# Patient Record
Sex: Female | Born: 1955 | Race: White | Hispanic: No | Marital: Single | Smoking: Current every day smoker
Health system: Southern US, Community
[De-identification: ages and names within clinical notes are randomized; demographics above are authoritative.]

## PROBLEM LIST (undated history)

## (undated) DIAGNOSIS — M1990 Unspecified osteoarthritis, unspecified site: Secondary | ICD-10-CM

## (undated) DIAGNOSIS — F32A Depression, unspecified: Secondary | ICD-10-CM

## (undated) DIAGNOSIS — F419 Anxiety disorder, unspecified: Secondary | ICD-10-CM

## (undated) DIAGNOSIS — E785 Hyperlipidemia, unspecified: Secondary | ICD-10-CM

## (undated) DIAGNOSIS — F329 Major depressive disorder, single episode, unspecified: Secondary | ICD-10-CM

## (undated) HISTORY — DX: Hyperlipidemia, unspecified: E78.5

## (undated) HISTORY — PX: TONSILLECTOMY: SUR1361

## (undated) HISTORY — DX: Depression, unspecified: F32.A

## (undated) HISTORY — DX: Anxiety disorder, unspecified: F41.9

## (undated) HISTORY — DX: Unspecified osteoarthritis, unspecified site: M19.90

## (undated) HISTORY — DX: Major depressive disorder, single episode, unspecified: F32.9

## (undated) HISTORY — PX: COLONOSCOPY: SHX174

---

## 2001-01-18 ENCOUNTER — Emergency Department (HOSPITAL_COMMUNITY): Admission: EM | Admit: 2001-01-18 | Discharge: 2001-01-18 | Payer: Self-pay | Admitting: *Deleted

## 2001-02-07 ENCOUNTER — Emergency Department (HOSPITAL_COMMUNITY): Admission: EM | Admit: 2001-02-07 | Discharge: 2001-02-07 | Payer: Self-pay | Admitting: Emergency Medicine

## 2011-08-03 ENCOUNTER — Institutional Professional Consult (permissible substitution): Payer: Self-pay | Admitting: Medical

## 2011-08-04 ENCOUNTER — Encounter: Payer: Self-pay | Admitting: Medical

## 2011-08-04 ENCOUNTER — Ambulatory Visit (INDEPENDENT_AMBULATORY_CARE_PROVIDER_SITE_OTHER): Payer: BC Managed Care – PPO | Admitting: Medical

## 2011-08-04 VITALS — BP 138/80 | HR 76 | Temp 98.3°F | Resp 18 | Ht 64.0 in | Wt 147.0 lb

## 2011-08-04 DIAGNOSIS — F4321 Adjustment disorder with depressed mood: Secondary | ICD-10-CM

## 2011-08-04 DIAGNOSIS — F32A Depression, unspecified: Secondary | ICD-10-CM

## 2011-08-04 DIAGNOSIS — F329 Major depressive disorder, single episode, unspecified: Secondary | ICD-10-CM

## 2011-08-04 MED ORDER — FLUOXETINE HCL 20 MG PO TABS: ORAL_TABLET | ORAL | Status: DC

## 2011-08-04 NOTE — Patient Instructions (Signed)
Begin Fluoxetine 1/2 tablet daily for the first week, then 1 tablet daily after that.  I want to see you back in 2 weeks to make sure the medication is helping.  Please don't hesitate to call for any reason.  I wrote a note today to get you back into work.

## 2011-08-05 ENCOUNTER — Encounter: Payer: Self-pay | Admitting: Medical

## 2011-08-05 DIAGNOSIS — F418 Other specified anxiety disorders: Secondary | ICD-10-CM | POA: Insufficient documentation

## 2011-08-05 DIAGNOSIS — F329 Major depressive disorder, single episode, unspecified: Secondary | ICD-10-CM | POA: Insufficient documentation

## 2011-08-05 NOTE — Progress Notes (Signed)
Subjective:   HPI  Hayley Dixon is a 56 y.o. female who presents as a new patient today.  She has not had routine medical care in several years.  She is past due on preventative care including mammogram.  She is here today because she needs a release back to work.  She took leave from work in Jun 24, 2023 after her mother passed away of lung and bone cancer.  Unfortunately in 2023/06/24, she has had 2 other deaths to grieve over including an aunt and her mother's best friend.  She has had a hard time dealing with these deaths.  She had never been on medication or had counseling prior for depression although she notes hx/o depression.  She has been out of work 8 weeks, and feels like she needs to get back to work to help move on. No other c/o.  The following portions of the patient's history were reviewed and updated as appropriate: allergies, current medications, past family history, past medical history, past social history, past surgical history and problem list.  Past Medical History  Diagnosis Date  . Depression     No Known Allergies   Review of Systems ROS reviewed and was negative other than noted in HPI or above.    Objective:   Physical Exam  General appearance: alert, no distress, WD/WN Psych: frequent crying, depressed affect, but pleasant  Assessment and Plan :     Encounter Diagnoses  Name Primary?  . Depression Yes  . Grieving    Discussed her concerns, recent deaths and grief. I recommended she begin medication to help and seek counseling.  I also recommended she cut back on alcohol.  She agrees to trial of Fluoxetine.  I think getting back to work will help get her in a routine again.   Letter give for return to work.  Gave list of psychiatric resources.  Recheck 2 wk. Call/return for any reason sooner prn.

## 2011-08-17 ENCOUNTER — Ambulatory Visit: Payer: BC Managed Care – PPO | Admitting: Medical

## 2017-04-04 DIAGNOSIS — E559 Vitamin D deficiency, unspecified: Secondary | ICD-10-CM | POA: Insufficient documentation

## 2018-12-11 DIAGNOSIS — E785 Hyperlipidemia, unspecified: Secondary | ICD-10-CM | POA: Insufficient documentation

## 2021-10-12 ENCOUNTER — Other Ambulatory Visit: Payer: Self-pay | Admitting: Rehabilitation

## 2021-10-12 DIAGNOSIS — M47817 Spondylosis without myelopathy or radiculopathy, lumbosacral region: Secondary | ICD-10-CM

## 2021-10-18 ENCOUNTER — Ambulatory Visit
Admission: RE | Admit: 2021-10-18 | Discharge: 2021-10-18 | Disposition: A | Discharge status: Home / Self Care | Payer: BC Managed Care – PPO | Source: Ambulatory Visit | Attending: Rehabilitation | Admitting: Rehabilitation

## 2021-10-18 DIAGNOSIS — M47817 Spondylosis without myelopathy or radiculopathy, lumbosacral region: Secondary | ICD-10-CM

## 2021-11-18 ENCOUNTER — Other Ambulatory Visit: Payer: Self-pay | Admitting: *Deleted

## 2021-11-18 DIAGNOSIS — R160 Hepatomegaly, not elsewhere classified: Secondary | ICD-10-CM

## 2022-02-24 ENCOUNTER — Ambulatory Visit: Payer: BLUE CROSS/BLUE SHIELD | Admitting: Family Medicine

## 2022-02-24 ENCOUNTER — Encounter: Payer: Self-pay | Admitting: Family Medicine

## 2022-02-24 VITALS — BP 97/72 | HR 101 | Temp 97.3°F | Ht 63.0 in | Wt 143.0 lb

## 2022-02-24 DIAGNOSIS — R197 Diarrhea, unspecified: Secondary | ICD-10-CM

## 2022-02-24 NOTE — Progress Notes (Unsigned)
New Patient Office Visit  Subjective    Patient ID: Hayley Dixon, female    DOB: [birth year]  Age: 66 y.o. MRN: [id number]  CC:  Chief Complaint  Patient presents with   Diarrhea    New pt. C/o diarrhea, gas and abdominal pain X6 weeks    HPI Hayley Dixon presents to establish care ***  Outpatient Encounter Medications as of 02/24/2022  Medication Sig   atorvastatin (LIPITOR) 10 MG tablet Take 1 tablet(s) every day by oral route for 90 days.   buPROPion (WELLBUTRIN XL) 300 MG 24 hr tablet Take 1 tablet every day by oral route for 30 days.   busPIRone (BUSPAR) 10 MG tablet Take 10 mg by mouth 2 (two) times daily.   diazepam (VALIUM) 5 MG tablet Take 5 mg by mouth daily as needed.   pregabalin (LYRICA) 25 MG capsule Take 1 capsule 4 times a day by oral route as directed.   [DISCONTINUED] FLUoxetine (PROZAC) 20 MG tablet 1/2 tablet po daily x 1 week, then 1 tablet po daily.   [DISCONTINUED] PARoxetine (PAXIL) 40 MG tablet Take 1 tablet(s) every day by oral route for 90 days.   No facility-administered encounter medications on file as of 02/24/2022.    Past Medical History:  Diagnosis Date   Depression     No past surgical history on file.  Family History  Problem Relation Age of Onset   Cancer Mother        died of lung and bone cancer    Social History   Socioeconomic History   Marital status: Single    Spouse name: Not on file   Number of children: Not on file   Years of education: Not on file   Highest education level: Not on file  Occupational History   Not on file  Tobacco Use   Smoking status: Every Day    Packs/day: 0.50    Types: Cigarettes    Start date: 70   Smokeless tobacco: Never  Substance and Sexual Activity   Alcohol use: Never    Alcohol/week: 28.0 standard drinks of alcohol    Types: 28 Cans of beer per week   Drug use: Never   Sexual activity: Yes    Partners: Male  Other Topics Concern   Not on file  Social History Narrative    Not on file   Social Determinants of Health   Financial Resource Strain: Not on file  Food Insecurity: Not on file  Transportation Needs: Not on file  Physical Activity: Not on file  Stress: Not on file  Social Connections: Not on file  Intimate Partner Violence: Not on file    ROS      Objective    BP 97/72   Pulse (!) 101   Temp (!) 97.3 F (36.3 C)   Ht _0  (1.6 m)   Wt 143 lb (64.9 kg)   SpO2 96%   BMI 25.33 kg/m   Physical Exam  {Labs (Optional):23779}    Assessment & Plan:   Problem List Items Addressed This Visit   None Visit Diagnoses     Diarrhea, unspecified type    -  Primary   Relevant Orders   CBC with Differential   Comprehensive Metabolic Panel (CMET)   Procalcitonin   Sed Rate (ESR)   DG Abd 1 View      Differential diagnosis: Colitis, ulcerative colitis, Crohn's, malabsorption, overflow diarrhea, viral infection, diverticulitis, medication side effect  - Patient not acutely  ill and exam relatively benign we will do work-up due to prolonged symptoms of diarrhea for 6 weeks - We discussed option of using Lomotil and we both agreed to hold off for now - Patient is well-hydrated hydrating well - We will follow-up in a few weeks and also have patient get scheduled for visit to establish care at this practice  Return in about 2 weeks (around 03/10/2022) for diarrhea follow-up.   Elwin Mocha, MD

## 2022-02-28 LAB — CBC WITH DIFFERENTIAL/PLATELET

## 2022-02-28 LAB — COMPREHENSIVE METABOLIC PANEL
ALT: 19 IU/L (ref 0–32)
AST: 18 IU/L (ref 0–40)
Albumin/Globulin Ratio: 1.7 (ref 1.2–2.2)
Albumin: 4.5 g/dL (ref 3.9–4.9)
Alkaline Phosphatase: 100 IU/L (ref 44–121)
BUN/Creatinine Ratio: 15 (ref 12–28)
BUN: 15 mg/dL (ref 8–27)
Bilirubin Total: <0.2 mg/dL (ref 0.0–1.2)
CO2: 19 mmol/L — ABNORMAL LOW (ref 20–29)
Calcium: 10.1 mg/dL (ref 8.7–10.3)
Chloride: 93 mmol/L — ABNORMAL LOW (ref 96–106)
Creatinine, Ser: 0.97 mg/dL (ref 0.57–1.00)
Globulin, Total: 2.7 g/dL (ref 1.5–4.5)
Glucose: 100 mg/dL — ABNORMAL HIGH (ref 70–99)
Potassium: 4.4 mmol/L (ref 3.5–5.2)
Sodium: 133 mmol/L — ABNORMAL LOW (ref 134–144)
Total Protein: 7.2 g/dL (ref 6.0–8.5)
eGFR: 66 mL/min/{1.73_m2} (ref >59)

## 2022-02-28 LAB — SEDIMENTATION RATE

## 2022-03-10 LAB — PROCALCITONIN

## 2022-03-10 LAB — SPECIMEN STATUS REPORT

## 2022-03-16 ENCOUNTER — Encounter: Payer: Self-pay | Admitting: Internal Medicine

## 2022-07-22 DIAGNOSIS — F411 Generalized anxiety disorder: Secondary | ICD-10-CM | POA: Diagnosis not present

## 2022-07-22 DIAGNOSIS — F331 Major depressive disorder, recurrent, moderate: Secondary | ICD-10-CM | POA: Diagnosis not present

## 2022-08-05 DIAGNOSIS — F411 Generalized anxiety disorder: Secondary | ICD-10-CM | POA: Diagnosis not present

## 2022-08-05 DIAGNOSIS — F331 Major depressive disorder, recurrent, moderate: Secondary | ICD-10-CM | POA: Diagnosis not present

## 2022-08-06 DIAGNOSIS — Z419 Encounter for procedure for purposes other than remedying health state, unspecified: Secondary | ICD-10-CM | POA: Diagnosis not present

## 2022-09-06 DIAGNOSIS — Z419 Encounter for procedure for purposes other than remedying health state, unspecified: Secondary | ICD-10-CM | POA: Diagnosis not present

## 2022-09-21 ENCOUNTER — Ambulatory Visit (INDEPENDENT_AMBULATORY_CARE_PROVIDER_SITE_OTHER): Payer: Medicaid Other | Admitting: Nurse Practitioner

## 2022-09-21 ENCOUNTER — Encounter: Payer: Self-pay | Admitting: Nurse Practitioner

## 2022-09-21 VITALS — BP 98/58 | HR 89 | Temp 98.0°F | Ht 63.0 in | Wt 149.0 lb

## 2022-09-21 DIAGNOSIS — R7303 Prediabetes: Secondary | ICD-10-CM | POA: Diagnosis not present

## 2022-09-21 DIAGNOSIS — H6123 Impacted cerumen, bilateral: Secondary | ICD-10-CM

## 2022-09-21 DIAGNOSIS — E782 Mixed hyperlipidemia: Secondary | ICD-10-CM | POA: Diagnosis not present

## 2022-09-21 DIAGNOSIS — R6883 Chills (without fever): Secondary | ICD-10-CM

## 2022-09-21 DIAGNOSIS — R1030 Lower abdominal pain, unspecified: Secondary | ICD-10-CM | POA: Diagnosis not present

## 2022-09-21 DIAGNOSIS — Z1159 Encounter for screening for other viral diseases: Secondary | ICD-10-CM

## 2022-09-21 DIAGNOSIS — Z720 Tobacco use: Secondary | ICD-10-CM

## 2022-09-21 DIAGNOSIS — K591 Functional diarrhea: Secondary | ICD-10-CM

## 2022-09-21 NOTE — Progress Notes (Signed)
duplicate

## 2022-09-21 NOTE — Patient Instructions (Signed)
Make sure to go to [facility] imaging for your Abdomen xray Do not take the xifaxin until I let you know.  Will f/u in 4-6 weeks.

## 2022-09-21 NOTE — Progress Notes (Signed)
Subjective:     Patient ID: Hayley Dixon , female    DOB: 12-15-1955 , 67 y.o.   MRN: [id number]   Chief Complaint  Patient presents with   Establish Care    HPI  She is here to establish care was going to [facility] NP until her insurance  and then seen Dr. Melynda Ripple last in September. She is divorced. She does live with her partner "Trey Paula". She has one son who lives in [place]. Not currently working - retired in July was working at [employer] (glass [employer]).   PMH - hyperlipidemia (atorvastatin - still taking), prediabetes (no medications), anxiety and depression (no longer seeing a psychologist, she does have a psychiatrist telehealth Dr Tomasa Hose once a month).   [facility] - mother - cancer - lung and bone cancer , father - hypertension, maternal grandmother - breast cancer.  Brother Loraine Leriche) - lupus, Matt - HTN   Patient presents today for diarrhea and water in ear. She reports diarrhea started 7 months ago. The diarrhea flares up for 10 days. Denies constipation. She reports chills and fever yesterday with diarrhea. These are new onset of symptoms. She admits she does not know what is causing her to have exacerbations of diarrhea. She reports eliminating dairy and other foods but nothing helps. She is taking probiotic to help with symptoms. She states it does not helping. Denies nausea and vomiting. She also reports lower abd pain throughout the day - aching pain. She has explosive diarrhea she is unable to leave her house. She denies taking any new medications. She completed colograd a couple of years ago but has not had a colonoscopy. She also goes psychiatrist to help with anxiety and depression. She goes via telehealth. She reported the symptoms to psychiatrist medication dosages were adjusted and she experienced no relief. Denies blood in stool.  She went to dr. Melynda Ripple in Sept 2023 He ordered a CT scan for belly but she never went to get the scan completed. He did not give her any  medications to help with diarrhea.  She also reports fullness in R ear for a coupe of months. She admits to sticking Qtip too for in ear and needs to get it irrigated.      Past Medical History:  Diagnosis Date   Depression      Family History  Problem Relation Age of Onset   Cancer Mother        died of lung and bone cancer     Current Outpatient Medications:    ALPRAZolam (XANAX) 0.5 MG tablet, Take 0.5 mg by mouth 2 (two) times daily as needed for anxiety., Disp: , Rfl:    atorvastatin (LIPITOR) 10 MG tablet, Take 1 tablet(s) every day by oral route for 90 days., Disp: , Rfl:    buPROPion (WELLBUTRIN XL) 150 MG 24 hr tablet, Take 150 mg by mouth every morning., Disp: , Rfl:    busPIRone (BUSPAR) 15 MG tablet, Take 15 mg by mouth 3 (three) times daily., Disp: , Rfl:    hydrOXYzine (ATARAX) 25 MG tablet, Take 25 mg by mouth 2 (two) times daily., Disp: , Rfl:    PARoxetine (PAXIL) 40 MG tablet, Take 40 mg by mouth every morning., Disp: , Rfl:    pregabalin (LYRICA) 200 MG capsule, Take 200 mg by mouth 2 (two) times daily., Disp: , Rfl:    No Known Allergies   Review of Systems  Constitutional: Negative.   Respiratory: Negative.  Cardiovascular: Negative.   All other systems reviewed and are negative.    Today's Vitals   09/21/22 1059  BP: (!) 98/58  Pulse: 89  Temp: 98 F (36.7 C)  TempSrc: Oral  SpO2: 96%  Weight: 149 lb (67.6 kg)  Height: 5\' 3"  (1.6 m)   Body mass index is 26.39 kg/m.   Objective:  Physical Exam Vitals reviewed.  Constitutional:      General: She is not in acute distress.    Appearance: Normal appearance.  Cardiovascular:     Rate and Rhythm: Normal rate and regular rhythm.     Pulses: Normal pulses.     Heart sounds: Normal heart sounds. No murmur heard. Pulmonary:     Effort: Pulmonary effort is normal. No respiratory distress.     Breath sounds: Normal breath sounds. No wheezing.  Abdominal:     General: Abdomen is flat. Bowel  sounds are normal. There is no distension.     Palpations: Abdomen is soft. There is no mass.     Tenderness: There is no abdominal tenderness. There is no right CVA tenderness, left CVA tenderness, guarding or rebound.     Hernia: No hernia is present.  Skin:    General: Skin is warm and dry.     Capillary Refill: Capillary refill takes less than 2 seconds.  Neurological:     General: No focal deficit present.     Mental Status: She is alert and oriented to person, place, and time.     Cranial Nerves: No cranial nerve deficit.     Motor: No weakness.  Psychiatric:        Mood and Affect: Mood normal.        Behavior: Behavior normal.        Thought Content: Thought content normal.        Judgment: Judgment normal.         Assessment And Plan:     1. Mixed hyperlipidemia Comments: Continue statin, will check lipid panel. - Lipid panel  2. Functional diarrhea Comments: Will check for viral vs bacterial vs inflammatory cause - H. pylori antigen, stool - Ova and parasite examination - Clostridium difficile Toxin A/B - Culture, Stool - CBC with Diff - CMP14+EGFR  3. Lower abdominal pain Comments: This has been ongoing, will refer to GI and check for any Gi viruses/bacterias. She has not had this workup done prior to this visit - Ambulatory referral to Gastroenterology - Lipase - Amylase  4. Bilateral impacted cerumen Comments: encouraged to use 1/2 water and 1/2 peroxide  5. Prediabetes Comments: Diet controlled, continue focusing on low sugar and carbohydrate diet - Hemoglobin A1c  6. Chills - Respiratory Panel w/ SARS-CoV2  7. Tobacco abuse Comments: discussed importance of quitting smoking and can cause worsening abdomen pain. Has a 40 ppy history of smoking will order CT lung scan low dose - CT CHEST LUNG CA SCREEN LOW DOSE W/O CM; Future  8. Encounter for hepatitis C screening test for low risk patient Will check Hepatitis C screening due to recent  recommendations to screen all adults 18 years and older - Hepatitis C antibody     Patient was given opportunity to ask questions. Patient verbalized understanding of the plan and was able to repeat key elements of the plan. All questions were answered to their satisfaction.  Arnette Felts, FNP   I, Arnette Felts, FNP, have reviewed all documentation for this visit. The documentation on 10/01/22 for the exam, diagnosis, procedures,  and orders are all accurate and complete.   IF YOU HAVE BEEN REFERRED TO A SPECIALIST, IT MAY TAKE 1-2 WEEKS TO SCHEDULE/PROCESS THE REFERRAL. IF YOU HAVE NOT HEARD FROM US/SPECIALIST IN TWO WEEKS, PLEASE GIVE [place] A CALL AT [phone].   THE PATIENT IS ENCOURAGED TO PRACTICE SOCIAL DISTANCING DUE TO THE COVID-19 PANDEMIC.

## 2022-09-22 DIAGNOSIS — K591 Functional diarrhea: Secondary | ICD-10-CM | POA: Diagnosis not present

## 2022-09-22 LAB — CMP14+EGFR
ALT: 33 IU/L — ABNORMAL HIGH (ref 0–32)
AST: 22 IU/L (ref 0–40)
Albumin/Globulin Ratio: 2 (ref 1.2–2.2)
Albumin: 4.7 g/dL (ref 3.9–4.9)
Alkaline Phosphatase: 100 IU/L (ref 44–121)
BUN/Creatinine Ratio: 21 (ref 12–28)
BUN: 15 mg/dL (ref 8–27)
Bilirubin Total: 0.3 mg/dL (ref 0.0–1.2)
CO2: 15 mmol/L — ABNORMAL LOW (ref 20–29)
Calcium: 9.5 mg/dL (ref 8.7–10.3)
Chloride: 101 mmol/L (ref 96–106)
Creatinine, Ser: 0.7 mg/dL (ref 0.57–1.00)
Globulin, Total: 2.4 g/dL (ref 1.5–4.5)
Glucose: 94 mg/dL (ref 70–99)
Potassium: 4.1 mmol/L (ref 3.5–5.2)
Sodium: 134 mmol/L (ref 134–144)
Total Protein: 7.1 g/dL (ref 6.0–8.5)
eGFR: 97 mL/min/{1.73_m2} (ref >59)

## 2022-09-22 LAB — CBC WITH DIFFERENTIAL/PLATELET
Basophils Absolute: 0.2 10*3/uL (ref 0.0–0.2)
Basos: 2 %
EOS (ABSOLUTE): 0.2 10*3/uL (ref 0.0–0.4)
Eos: 2 %
Hematocrit: 43.6 % (ref 34.0–46.6)
Hemoglobin: 14.9 g/dL (ref 11.1–15.9)
Immature Grans (Abs): 0 10*3/uL (ref 0.0–0.1)
Immature Granulocytes: 0 %
Lymphocytes Absolute: 3.7 10*3/uL — ABNORMAL HIGH (ref 0.7–3.1)
Lymphs: 36 %
MCH: 29.2 pg (ref 26.6–33.0)
MCHC: 34.2 g/dL (ref 31.5–35.7)
MCV: 85 fL (ref 79–97)
Monocytes Absolute: 0.7 10*3/uL (ref 0.1–0.9)
Monocytes: 7 %
Neutrophils Absolute: 5.6 10*3/uL (ref 1.4–7.0)
Neutrophils: 53 %
Platelets: 329 10*3/uL (ref 150–450)
RBC: 5.11 x10E6/uL (ref 3.77–5.28)
RDW: 12.9 % (ref 11.7–15.4)
WBC: 10.3 10*3/uL (ref 3.4–10.8)

## 2022-09-22 LAB — LIPID PANEL
Chol/HDL Ratio: 3.3 ratio (ref 0.0–4.4)
Cholesterol, Total: 198 mg/dL (ref 100–199)
HDL: 60 mg/dL (ref >39)
LDL Chol Calc (NIH): 118 mg/dL — ABNORMAL HIGH (ref 0–99)
Triglycerides: 110 mg/dL (ref 0–149)
VLDL Cholesterol Cal: 20 mg/dL (ref 5–40)

## 2022-09-22 LAB — AMYLASE: Amylase: 35 U/L (ref 31–110)

## 2022-09-22 LAB — HEPATITIS C ANTIBODY: Hep C Virus Ab: NONREACTIVE

## 2022-09-22 LAB — HEMOGLOBIN A1C
Est. average glucose Bld gHb Est-mCnc: 128 mg/dL
Hgb A1c MFr Bld: 6.1 % — ABNORMAL HIGH (ref 4.8–5.6)

## 2022-09-22 LAB — LIPASE: Lipase: 27 U/L (ref 14–72)

## 2022-09-24 LAB — CLOSTRIDIUM DIFFICILE EIA: C difficile Toxins A+B, EIA: NEGATIVE

## 2022-09-24 LAB — H. PYLORI ANTIGEN, STOOL: H pylori Ag, Stl: NEGATIVE

## 2022-09-25 LAB — RESPIRATORY PANEL W/ SARS-COV2

## 2022-09-27 LAB — OVA AND PARASITE EXAMINATION

## 2022-09-30 LAB — STOOL CULTURE: E coli, Shiga toxin Assay: NEGATIVE

## 2022-10-01 DIAGNOSIS — R1030 Lower abdominal pain, unspecified: Secondary | ICD-10-CM | POA: Insufficient documentation

## 2022-10-01 DIAGNOSIS — Z720 Tobacco use: Secondary | ICD-10-CM | POA: Insufficient documentation

## 2022-10-01 DIAGNOSIS — R7303 Prediabetes: Secondary | ICD-10-CM | POA: Insufficient documentation

## 2022-10-01 DIAGNOSIS — K591 Functional diarrhea: Secondary | ICD-10-CM | POA: Insufficient documentation

## 2022-10-06 DIAGNOSIS — F411 Generalized anxiety disorder: Secondary | ICD-10-CM | POA: Diagnosis not present

## 2022-10-06 DIAGNOSIS — F331 Major depressive disorder, recurrent, moderate: Secondary | ICD-10-CM | POA: Diagnosis not present

## 2022-10-06 DIAGNOSIS — Z419 Encounter for procedure for purposes other than remedying health state, unspecified: Secondary | ICD-10-CM | POA: Diagnosis not present

## 2022-10-12 ENCOUNTER — Encounter: Payer: Self-pay | Admitting: Nurse Practitioner

## 2022-10-21 ENCOUNTER — Ambulatory Visit: Payer: Medicaid Other | Admitting: Nurse Practitioner

## 2022-10-21 NOTE — Progress Notes (Deleted)
  Hershal Coria Donis Kotowski,acting as a Neurosurgeon for Arnette Felts, FNP.,have documented all relevant documentation on the behalf of Arnette Felts, FNP,as directed by  Arnette Felts, FNP while in the presence of Arnette Felts, FNP.    Subjective:     Patient ID: Hayley Dixon , female    DOB: December 29, 1955 , 67 y.o.   MRN: [id number]   No chief complaint on file.   HPI  Patient presents today for a chol check, patient reports compliance with medications and has no other concerns today.     Past Medical History:  Diagnosis Date  . Depression      Family History  Problem Relation Age of Onset  . Cancer Mother        died of lung and bone cancer     Current Outpatient Medications:  .  ALPRAZolam (XANAX) 0.5 MG tablet, Take 0.5 mg by mouth 2 (two) times daily as needed for anxiety., Disp: , Rfl:  .  atorvastatin (LIPITOR) 10 MG tablet, Take 1 tablet(s) every day by oral route for 90 days., Disp: , Rfl:  .  buPROPion (WELLBUTRIN XL) 150 MG 24 hr tablet, Take 150 mg by mouth every morning., Disp: , Rfl:  .  busPIRone (BUSPAR) 15 MG tablet, Take 15 mg by mouth 3 (three) times daily., Disp: , Rfl:  .  hydrOXYzine (ATARAX) 25 MG tablet, Take 25 mg by mouth 2 (two) times daily., Disp: , Rfl:  .  PARoxetine (PAXIL) 40 MG tablet, Take 40 mg by mouth every morning., Disp: , Rfl:  .  pregabalin (LYRICA) 200 MG capsule, Take 200 mg by mouth 2 (two) times daily., Disp: , Rfl:    No Known Allergies   Review of Systems   There were no vitals filed for this visit. There is no height or weight on file to calculate BMI.  The 10-year ASCVD risk score (Arnett DK, et al., 2019) is: 5.4%   Values used to calculate the score:     Age: 32 years     Sex: Female     Is Non-Hispanic African American: No     Diabetic: No     Tobacco smoker: Yes     Systolic Blood Pressure: 98 mmHg     Is BP treated: No     HDL Cholesterol: 60 mg/dL     Total Cholesterol: 198 mg/dL ++ Objective:  Physical Exam       Assessment And Plan:     1. Mixed hyperlipidemia    No follow-ups on file.  Patient was given opportunity to ask questions. Patient verbalized understanding of the plan and was able to repeat key elements of the plan. All questions were answered to their satisfaction.  Marlyn Corporal, CMA   I, Marlyn Corporal, CMA, have reviewed all documentation for this visit. The documentation on 10/21/22 for the exam, diagnosis, procedures, and orders are all accurate and complete.   IF YOU HAVE BEEN REFERRED TO A SPECIALIST, IT MAY TAKE 1-2 WEEKS TO SCHEDULE/PROCESS THE REFERRAL. IF YOU HAVE NOT HEARD FROM US/SPECIALIST IN TWO WEEKS, PLEASE GIVE [place] A CALL AT [phone] X [phone].   THE PATIENT IS ENCOURAGED TO PRACTICE SOCIAL DISTANCING DUE TO THE COVID-19 PANDEMIC.

## 2022-10-22 ENCOUNTER — Ambulatory Visit
Admission: RE | Admit: 2022-10-22 | Discharge: 2022-10-22 | Disposition: A | Discharge status: Home / Self Care | Payer: Medicaid Other | Source: Ambulatory Visit | Attending: Nurse Practitioner | Admitting: Nurse Practitioner

## 2022-10-22 DIAGNOSIS — Z720 Tobacco use: Secondary | ICD-10-CM

## 2022-10-26 ENCOUNTER — Telehealth: Payer: Self-pay

## 2022-10-26 ENCOUNTER — Telehealth: Payer: Self-pay | Admitting: Nurse Practitioner

## 2022-10-26 MED ORDER — ATORVASTATIN CALCIUM 10 MG PO TABS: ORAL_TABLET | ORAL | 2 refills | Status: DC

## 2022-10-26 NOTE — Telephone Encounter (Signed)
Meds sent

## 2022-10-26 NOTE — Telephone Encounter (Signed)
Prescription Request  10/26/2022  LOV: 09/21/2022  What is the name of the medication or equipment? atorvastatin   Have you contacted your pharmacy to request a refill? Yes   Which pharmacy would you like this sent to?  [facility] [id number] [place], [place] - [address] [facility] [address] [place] [address] Phone: [phone] Fax: [phone]    Patient notified that their request is being sent to the clinical staff for review and that they should receive a response within 2 business days.   Please advise at [facility] [phone]

## 2022-11-06 DIAGNOSIS — Z419 Encounter for procedure for purposes other than remedying health state, unspecified: Secondary | ICD-10-CM | POA: Diagnosis not present

## 2022-12-06 ENCOUNTER — Ambulatory Visit: Payer: BLUE CROSS/BLUE SHIELD | Admitting: Nurse Practitioner

## 2022-12-06 DIAGNOSIS — Z419 Encounter for procedure for purposes other than remedying health state, unspecified: Secondary | ICD-10-CM | POA: Diagnosis not present

## 2022-12-06 NOTE — Progress Notes (Deleted)
Madelaine Bhat, CMA,acting as a Neurosurgeon for Arnette Felts, FNP.,have documented all relevant documentation on the behalf of Arnette Felts, FNP,as directed by  Arnette Felts, FNP while in the presence of Arnette Felts, FNP.  Subjective:  Patient ID: Hayley Dixon , female    DOB: 04-Jan-1956 , 67 y.o.   MRN: [id number]  No chief complaint on file.   HPI  Patient presents today for a Pre DM, and chol check. Patient reports compliance with medications and has no other concerns today    Past Medical History:  Diagnosis Date  . Depression      Family History  Problem Relation Age of Onset  . Cancer Mother        died of lung and bone cancer     Current Outpatient Medications:  .  ALPRAZolam (XANAX) 0.5 MG tablet, Take 0.5 mg by mouth 2 (two) times daily as needed for anxiety., Disp: , Rfl:  .  atorvastatin (LIPITOR) 10 MG tablet, Take 1 tablet(s) every day by oral route for 90 days., Disp: 90 tablet, Rfl: 2 .  buPROPion (WELLBUTRIN XL) 150 MG 24 hr tablet, Take 150 mg by mouth every morning., Disp: , Rfl:  .  busPIRone (BUSPAR) 15 MG tablet, Take 15 mg by mouth 3 (three) times daily., Disp: , Rfl:  .  hydrOXYzine (ATARAX) 25 MG tablet, Take 25 mg by mouth 2 (two) times daily., Disp: , Rfl:  .  PARoxetine (PAXIL) 40 MG tablet, Take 40 mg by mouth every morning., Disp: , Rfl:  .  pregabalin (LYRICA) 200 MG capsule, Take 200 mg by mouth 2 (two) times daily., Disp: , Rfl:    No Known Allergies   Review of Systems   There were no vitals filed for this visit. There is no height or weight on file to calculate BMI.  Wt Readings from Last 3 Encounters:  09/21/22 149 lb (67.6 kg)  02/24/22 143 lb (64.9 kg)  08/04/11 147 lb (66.7 kg)    The 10-year ASCVD risk score (Arnett DK, et al., 2019) is: 5.4%   Values used to calculate the score:     Age: 39 years     Sex: Female     Is Non-Hispanic African American: No     Diabetic: No     Tobacco smoker: Yes     Systolic Blood Pressure: 98  mmHg     Is BP treated: No     HDL Cholesterol: 60 mg/dL     Total Cholesterol: 198 mg/dL  Objective:  Physical Exam      Assessment And Plan:  Mixed hyperlipidemia  Prediabetes    No follow-ups on file.  Patient was given opportunity to ask questions. Patient verbalized understanding of the plan and was able to repeat key elements of the plan. All questions were answered to their satisfaction.    Jeanell Sparrow, FNP, have reviewed all documentation for this visit. The documentation on 12/06/22 for the exam, diagnosis, procedures, and orders are all accurate and complete.   IF YOU HAVE BEEN REFERRED TO A SPECIALIST, IT MAY TAKE 1-2 WEEKS TO SCHEDULE/PROCESS THE REFERRAL. IF YOU HAVE NOT HEARD FROM US/SPECIALIST IN TWO WEEKS, PLEASE GIVE [place] A CALL AT [phone] X [phone].

## 2022-12-13 ENCOUNTER — Telehealth: Payer: Self-pay

## 2022-12-13 NOTE — Telephone Encounter (Signed)
Chart review completed for patient. Patient is due for screening mammogram. Mychart message sent to patient to inquire about scheduling mammogram.  Marialy Urbanczyk, Population Health Specialist.  

## 2022-12-19 NOTE — Progress Notes (Signed)
12/19/2022 Hayley Dixon [id number] [birth year]   CHIEF COMPLAINT: Diarrhea   HISTORY OF PRESENT ILLNESS: Hayley Dixon is a 67 year old female with a past medical history of arthritis, anxiety, depression and hyperlipidemia.  S/P tonsillectomy at the age of 28.  She presents to our office today as referred by Arnette Felts NP for further evaluation regarding diarrhea which started one year ago.  She endorsed having excessive diarrhea during the day and nighttime for about 10 days 1 year ago, no specific food or stress triggers.  Since then, she continues to have multiple nonbloody diarrhea bowel movements during the day and sometimes at night.  Diearrhea has a strange odor.  She intermittently has LLQ pain which is not severe.  She sometimes passes a normal solid bowel movement for 3-4 consecutive days and the diarrhea recurs.  No new medications or antibiotics within the past year.  She took a probiotic for 3 months without improvement.  She went on a dairy free diet for 2 to 3 weeks without improvement.  She stopped eating wheat for several weeks without improvement.  She has lost approximately 9 pounds over the past 3 to 4 months.  Her bowel pattern is interfering with her quality of living.  She infrequently takes Imodium.  She denies ever having a colonoscopy.  She reported undergoing a Cologuard study more than 5 years ago which was negative.  No known family history of IBD or colorectal cancer.  Two maternal cousins with history of celiac disease.  Stool cultures, O&P and C. difficile toxin A and B were negative 09/22/2022.     Latest Ref Rng & Units 09/21/2022   12:17 PM 02/24/2022   11:23 AM  CBC  WBC 3.4 - 10.8 x10E3/uL 10.3  CANCELED   Hemoglobin 11.1 - 15.9 g/dL 10.2  CANCELED   Hematocrit 34.0 - 46.6 % 43.6  CANCELED   Platelets 150 - 450 x10E3/uL 329  CANCELED        Latest Ref Rng & Units 09/21/2022   12:17 PM 02/24/2022   11:23 AM  CMP  Glucose 70 - 99 mg/dL 94  725   BUN  8 - 27 mg/dL 15  15   Creatinine 3.66 - 1.00 mg/dL 4.40  3.47   Sodium 425 - 144 mmol/L 134  133   Potassium 3.5 - 5.2 mmol/L 4.1  4.4   Chloride 96 - 106 mmol/L 101  93   CO2 20 - 29 mmol/L 15  19   Calcium 8.7 - 10.3 mg/dL 9.5  95.6   Total Protein 6.0 - 8.5 g/dL 7.1  7.2   Total Bilirubin 0.0 - 1.2 mg/dL 0.3  <3.8   Alkaline Phos 44 - 121 IU/L 100  100   AST 0 - 40 IU/L 22  18   ALT 0 - 32 IU/L 33  19     Stool culture negative, O & P negative, C. Diff toxin A and B negative and H. Pylori stool antigen negative 09/22/2022  Social History: She is divorced.  She has 1 son.  Retired.  She smokes 10 cigarettes x 40+ years. No alcohol use. No drug use.   Family History: Mother had lung and bone cancer. Father had heart disease.  2 maternal cousins with history of celiac disease.  No known family history of esophageal, gastric, colon, liver or pancreatic cancer.  No Known Allergies    Outpatient Encounter Medications as of 12/20/2022  Medication Sig   ALPRAZolam Prudy Feeler)  0.5 MG tablet Take 0.5 mg by mouth 2 (two) times daily as needed for anxiety.   atorvastatin (LIPITOR) 10 MG tablet Take 1 tablet(s) every day by oral route for 90 days.   buPROPion (WELLBUTRIN XL) 150 MG 24 hr tablet Take 150 mg by mouth every morning.   busPIRone (BUSPAR) 15 MG tablet Take 15 mg by mouth 3 (three) times daily.   hydrOXYzine (ATARAX) 25 MG tablet Take 25 mg by mouth 2 (two) times daily.   PARoxetine (PAXIL) 40 MG tablet Take 40 mg by mouth every morning.   pregabalin (LYRICA) 200 MG capsule Take 200 mg by mouth 2 (two) times daily.   No facility-administered encounter medications on file as of 12/20/2022.   REVIEW OF SYSTEMS:  Gen: Denies fever, sweats or chills. No weight loss.  CV: Denies chest pain, palpitations or edema. Resp: Denies cough, shortness of breath of hemoptysis.  GI: See HPI.  GU: Denies urinary burning, blood in urine, increased urinary frequency or incontinence. MS:+ Back pain and  arthritis. Derm: Denies rash, itchiness, skin lesions or unhealing ulcers. Psych: + Anxiety and depression. Heme: Denies bruising, easy bleeding. Neuro:  Denies headaches, dizziness or paresthesias. Endo:  Denies any problems with DM, thyroid or adrenal function.  PHYSICAL EXAM: BP (!) 90/58 (BP Location: Left Arm, Patient Position: Sitting, Cuff Size: Normal)   Pulse 88   Ht 5' 2.5" (1.588 m) Comment: height measured without shoes  Wt 141 lb 6 oz (64.1 kg)   BMI 25.45 kg/m   Wt Readings from Last 3 Encounters:  12/20/22 141 lb 6 oz (64.1 kg)  09/21/22 149 lb (67.6 kg)  02/24/22 143 lb (64.9 kg)     General: 67 year old female in no acute distress. Head: Normocephalic and atraumatic. Eyes:  Sclerae non-icteric, conjunctive pink. Ears: Normal auditory acuity. Mouth: Dentition intact. No ulcers or lesions.  Neck: Supple, no lymphadenopathy or thyromegaly.  Lungs: Clear bilaterally to auscultation without wheezes, crackles or rhonchi. Heart: Regular rate and rhythm. No murmur, rub or gallop appreciated.  Abdomen: Soft, nontender, nondistended. No masses. No hepatosplenomegaly. Normoactive bowel sounds x 4 quadrants.  Rectal: Deferred.  Musculoskeletal: Symmetrical with no gross deformities. Skin: Warm and dry. No rash or lesions on visible extremities. Extremities: No edema. Neurological: Alert oriented x 4, no focal deficits.  Psychological:  Alert and cooperative. Normal mood and affect.  ASSESSMENT AND PLAN:  66 year old female with chronic diarrhea, intermittent LLQ pain x 1 year.  Negative stool culture, O&P and C. difficile toxin A and B April 2024. -Colonoscopy to rule out microscopic colitis/IBD benefits and risks discussed including risk with sedation, risk of bleeding, perforation and infection  -CBC, CMP, CRP, sed rate, TTG, IgA, TSH and fecal pancreatic elastase level -Imodium 2 tabs p.o. twice daily, stop if no BM in 24 hours -Benefiber 1 tablespoon daily (start  slowly) to bulk up stool.  Stop Benefiber if symptoms worsen. -Patient to contact office if diarrhea or LLQ pain worsens -Further recommendations to be determined after colonoscopy completed  Chronic tobacco use -Smoking cessation recommended -Follow-up with PCP   CC:  Arnette Felts, FNP

## 2022-12-20 ENCOUNTER — Encounter: Payer: Self-pay | Admitting: Nurse Practitioner

## 2022-12-20 ENCOUNTER — Ambulatory Visit: Payer: Medicaid Other | Admitting: Nurse Practitioner

## 2022-12-20 ENCOUNTER — Other Ambulatory Visit (INDEPENDENT_AMBULATORY_CARE_PROVIDER_SITE_OTHER): Payer: Medicaid Other

## 2022-12-20 VITALS — BP 90/58 | HR 88 | Ht 62.5 in | Wt 141.4 lb

## 2022-12-20 DIAGNOSIS — R1032 Left lower quadrant pain: Secondary | ICD-10-CM

## 2022-12-20 DIAGNOSIS — K529 Noninfective gastroenteritis and colitis, unspecified: Secondary | ICD-10-CM

## 2022-12-20 LAB — COMPREHENSIVE METABOLIC PANEL
ALT: 29 U/L (ref 0–35)
AST: 24 U/L (ref 0–37)
Albumin: 4 g/dL (ref 3.5–5.2)
Alkaline Phosphatase: 72 U/L (ref 39–117)
BUN: 9 mg/dL (ref 6–23)
CO2: 22 mEq/L (ref 19–32)
Calcium: 9.5 mg/dL (ref 8.4–10.5)
Chloride: 107 mEq/L (ref 96–112)
Creatinine, Ser: 0.7 mg/dL (ref 0.40–1.20)
GFR: 91.15 mL/min (ref >60.00)
Glucose, Bld: 92 mg/dL (ref 70–99)
Potassium: 3.8 mEq/L (ref 3.5–5.1)
Sodium: 137 mEq/L (ref 135–145)
Total Bilirubin: 0.4 mg/dL (ref 0.2–1.2)
Total Protein: 6.7 g/dL (ref 6.0–8.3)

## 2022-12-20 LAB — CBC WITH DIFFERENTIAL/PLATELET
Basophils Absolute: 0.1 10*3/uL (ref 0.0–0.1)
Basophils Relative: 1.3 % (ref 0.0–3.0)
Eosinophils Absolute: 0.4 10*3/uL (ref 0.0–0.7)
Eosinophils Relative: 3.2 % (ref 0.0–5.0)
HCT: 40.7 % (ref 36.0–46.0)
Hemoglobin: 13.3 g/dL (ref 12.0–15.0)
Lymphocytes Relative: 29.6 % (ref 12.0–46.0)
Lymphs Abs: 3.5 10*3/uL (ref 0.7–4.0)
MCHC: 32.7 g/dL (ref 30.0–36.0)
MCV: 86.4 fl (ref 78.0–100.0)
Monocytes Absolute: 0.7 10*3/uL (ref 0.1–1.0)
Monocytes Relative: 6.2 % (ref 3.0–12.0)
Neutro Abs: 7 10*3/uL (ref 1.4–7.7)
Neutrophils Relative %: 59.7 % (ref 43.0–77.0)
Platelets: 316 10*3/uL (ref 150.0–400.0)
RBC: 4.72 Mil/uL (ref 3.87–5.11)
RDW: 13.7 % (ref 11.5–15.5)
WBC: 11.7 10*3/uL — ABNORMAL HIGH (ref 4.0–10.5)

## 2022-12-20 LAB — TSH: TSH: 1.67 u[IU]/mL (ref 0.35–5.50)

## 2022-12-20 LAB — C-REACTIVE PROTEIN: CRP: <1 mg/dL (ref 0.5–20.0)

## 2022-12-20 LAB — SEDIMENTATION RATE: Sed Rate: 15 mm/hr (ref 0–30)

## 2022-12-20 MED ORDER — NA SULFATE-K SULFATE-MG SULF 17.5-3.13-1.6 GM/177ML PO SOLN: 1.0000 | Freq: Once | ORAL | 0 refills | Status: AC

## 2022-12-20 NOTE — Progress Notes (Signed)
I agree with the assessment and plan as outlined by Ms. Kennedy-Smith. 

## 2022-12-20 NOTE — Patient Instructions (Addendum)
You have been scheduled for a colonoscopy. Please follow written instructions given to you at your visit today.   Please pick up your prep supplies at the pharmacy within the next 1-3 days.  If you use inhalers (even only as needed), please bring them with you on the day of your procedure.  DO NOT TAKE 7 DAYS PRIOR TO TEST- Trulicity (dulaglutide) Ozempic, Wegovy (semaglutide) Mounjaro (tirzepatide) Bydureon Bcise (exanatide extended release)  DO NOT TAKE 1 DAY PRIOR TO YOUR TEST Rybelsus (semaglutide) Adlyxin (lixisenatide) Victoza (liraglutide) Byetta (exanatide) ___________________________________________________________________________ Your provider has requested that you go to the basement level for lab work before leaving today. Press "B" on the elevator. The lab is located at the first door on the left as you exit the elevator.  Imodium- 2 tablets twice daily as needed  Benefiber- 1 tablespoon daily to bulk up stool  Due to recent changes in healthcare laws, you may see the results of your imaging and laboratory studies on MyChart before your provider has had a chance to review them.  We understand that in some cases there may be results that are confusing or concerning to you. Not all laboratory results come back in the same time frame and the provider may be waiting for multiple results in order to interpret others.  Please give [place] 48 hours in order for your provider to thoroughly review all the results before contacting the office for clarification of your results.

## 2022-12-21 LAB — IGA: Immunoglobulin A: 140 mg/dL (ref 70–320)

## 2022-12-21 LAB — TISSUE TRANSGLUTAMINASE ABS,IGG,IGA
(tTG) Ab, IgA: <1 U/mL
(tTG) Ab, IgG: <1 U/mL

## 2022-12-23 ENCOUNTER — Encounter: Payer: Self-pay | Admitting: Certified Registered Nurse Anesthetist

## 2022-12-24 ENCOUNTER — Encounter: Payer: Medicaid Other | Admitting: Internal Medicine

## 2022-12-30 ENCOUNTER — Ambulatory Visit: Payer: Self-pay | Admitting: Nurse Practitioner

## 2023-01-06 ENCOUNTER — Telehealth: Payer: Self-pay | Admitting: Nurse Practitioner

## 2023-01-06 DIAGNOSIS — Z419 Encounter for procedure for purposes other than remedying health state, unspecified: Secondary | ICD-10-CM | POA: Diagnosis not present

## 2023-01-06 NOTE — Telephone Encounter (Signed)
Contact pt & pt verbalized understanding of lab results.

## 2023-01-06 NOTE — Telephone Encounter (Signed)
PT is returning call to discuss lab results. Please advise. 

## 2023-01-11 DIAGNOSIS — F331 Major depressive disorder, recurrent, moderate: Secondary | ICD-10-CM | POA: Diagnosis not present

## 2023-01-11 DIAGNOSIS — F411 Generalized anxiety disorder: Secondary | ICD-10-CM | POA: Diagnosis not present

## 2023-01-27 ENCOUNTER — Ambulatory Visit (AMBULATORY_SURGERY_CENTER): Payer: Medicaid Other

## 2023-01-27 VITALS — Ht 62.5 in | Wt 140.0 lb

## 2023-01-27 DIAGNOSIS — K529 Noninfective gastroenteritis and colitis, unspecified: Secondary | ICD-10-CM

## 2023-01-27 DIAGNOSIS — R1032 Left lower quadrant pain: Secondary | ICD-10-CM

## 2023-01-27 NOTE — Progress Notes (Signed)

## 2023-02-06 DIAGNOSIS — Z419 Encounter for procedure for purposes other than remedying health state, unspecified: Secondary | ICD-10-CM | POA: Diagnosis not present

## 2023-02-11 ENCOUNTER — Encounter: Payer: Self-pay | Admitting: Internal Medicine

## 2023-02-11 ENCOUNTER — Ambulatory Visit: Payer: Medicare HMO | Admitting: Internal Medicine

## 2023-02-11 VITALS — BP 98/64 | HR 72 | Temp 96.9°F | Resp 14 | Ht 62.5 in | Wt 140.0 lb

## 2023-02-11 DIAGNOSIS — F419 Anxiety disorder, unspecified: Secondary | ICD-10-CM | POA: Diagnosis not present

## 2023-02-11 DIAGNOSIS — E785 Hyperlipidemia, unspecified: Secondary | ICD-10-CM | POA: Diagnosis not present

## 2023-02-11 DIAGNOSIS — R197 Diarrhea, unspecified: Secondary | ICD-10-CM | POA: Diagnosis not present

## 2023-02-11 DIAGNOSIS — K529 Noninfective gastroenteritis and colitis, unspecified: Secondary | ICD-10-CM

## 2023-02-11 DIAGNOSIS — F32A Depression, unspecified: Secondary | ICD-10-CM | POA: Diagnosis not present

## 2023-02-11 MED ORDER — SODIUM CHLORIDE 0.9 % IV SOLN: 500.0000 mL | Freq: Once | INTRAVENOUS | Status: AC

## 2023-02-11 NOTE — Progress Notes (Signed)
GASTROENTEROLOGY PROCEDURE H&P NOTE   Primary Care Physician: Arnette Felts, FNP    Reason for Procedure:   Diarrhea  Plan:    Colonoscopy  Patient is appropriate for endoscopic procedure(s) in the ambulatory ([facility]) setting.  The nature of the procedure, as well as the risks, benefits, and alternatives were carefully and thoroughly reviewed with the patient. Ample time for discussion and questions allowed. The patient understood, was satisfied, and agreed to proceed.     HPI: Hayley Dixon is a 67 y.o. female who presents for colonoscopy for evaluation of diarrhea .  Patient was most recently seen in the Gastroenterology Clinic on 12/20/22.  No interval change in medical history since that appointment. Please refer to that note for full details regarding GI history and clinical presentation.   Past Medical History:  Diagnosis Date   Anxiety    Arthritis    Depression    HLD (hyperlipidemia)     Past Surgical History:  Procedure Laterality Date   TONSILLECTOMY      Prior to Admission medications   Medication Sig Start Date End Date Taking? Authorizing Provider  ALPRAZolam Prudy Feeler) 0.5 MG tablet Take 0.5 mg by mouth 2 (two) times daily as needed for anxiety. 08/31/22  Yes [provider]  atorvastatin (LIPITOR) 10 MG tablet Take 1 tablet(s) every day by oral route for 90 days. 10/26/22  Yes Arnette Felts, FNP  buPROPion (WELLBUTRIN XL) 150 MG 24 hr tablet Take 150 mg by mouth every morning. 08/19/22  Yes [provider]  busPIRone (BUSPAR) 15 MG tablet Take 15 mg by mouth 3 (three) times daily. 07/19/22  Yes [provider]  hydrOXYzine (ATARAX) 25 MG tablet Take 25 mg by mouth 2 (two) times daily. 08/31/22  Yes [provider]  PARoxetine (PAXIL) 40 MG tablet Take 40 mg by mouth every morning.   Yes [provider]  pregabalin (LYRICA) 200 MG capsule Take 200 mg by mouth 2 (two) times daily as needed. 08/20/22   [provider]    Current Outpatient Medications  Medication Sig Dispense Refill   ALPRAZolam (XANAX) 0.5 MG tablet Take 0.5 mg by mouth 2 (two) times daily as needed for anxiety.     atorvastatin (LIPITOR) 10 MG tablet Take 1 tablet(s) every day by oral route for 90 days. 90 tablet 2   buPROPion (WELLBUTRIN XL) 150 MG 24 hr tablet Take 150 mg by mouth every morning.     busPIRone (BUSPAR) 15 MG tablet Take 15 mg by mouth 3 (three) times daily.     hydrOXYzine (ATARAX) 25 MG tablet Take 25 mg by mouth 2 (two) times daily.     PARoxetine (PAXIL) 40 MG tablet Take 40 mg by mouth every morning.     pregabalin (LYRICA) 200 MG capsule Take 200 mg by mouth 2 (two) times daily as needed.     Current Facility-Administered Medications  Medication Dose Route Frequency Provider Last Rate Last Admin   0.9 %  sodium chloride infusion  500 mL Intravenous Once Imogene Burn, MD        Allergies as of 02/11/2023   (No Known Allergies)    Family History  Problem Relation Age of Onset   Cancer Mother        died of lung and bone cancer   Heart disease Father    Hypertension Father    Lupus Brother    Esophageal cancer Brother    Hypertension Brother    Throat cancer  Brother    Breast cancer Maternal Grandmother    Colon cancer Neg Hx    Colon polyps Neg Hx    Rectal cancer Neg Hx    Stomach cancer Neg Hx     Social History   Socioeconomic History   Marital status: Single    Spouse name: Not on file   Number of children: 1   Years of education: Not on file   Highest education level: Not on file  Occupational History   Occupation: retired  Tobacco Use   Smoking status: Every Day    Current packs/day: 0.50    Average packs/day: 0.5 packs/day for 47.7 years (23.8 ttl pk-yrs)    Types: Cigarettes    Start date: 32   Smokeless tobacco: Never  Vaping Use   Vaping status: Never Used  Substance and Sexual Activity   Alcohol use: Never    Alcohol/week: 28.0 standard drinks of alcohol     Types: 28 Cans of beer per week   Drug use: Never   Sexual activity: Yes    Partners: Male  Other Topics Concern   Not on file  Social History Narrative   Not on file   Social Determinants of Health   Financial Resource Strain: Not on file  Food Insecurity: Not on file  Transportation Needs: Not on file  Physical Activity: Not on file  Stress: Not on file  Social Connections: Not on file  Intimate Partner Violence: Not on file    Physical Exam: Vital signs in last 24 hours: BP (!) 144/70   Pulse 86   Temp (!) 96.9 F (36.1 C)   Ht 5' 2.5" (1.588 m)   Wt 140 lb (63.5 kg)   SpO2 100%   BMI 25.20 kg/m  GEN: NAD EYE: Sclerae anicteric ENT: MMM CV: Non-tachycardic Pulm: No increased WOB GI: Soft NEURO:  Alert & Oriented   Eulah Pont, MD [facility]   02/11/2023 10:07 AM

## 2023-02-11 NOTE — Op Note (Addendum)
[facility] Patient Name: Hayley Dixon Procedure Date: 02/11/2023 10:04 AM MRN: [id number] Endoscopist: Madelyn Brunner [place] , , [id number] Age: 67 Referring MD:  Date of Birth: 07/19/1955 Gender: Female Account #: [id number] Procedure:                Colonoscopy Indications:              Chronic diarrhea Medicines:                Monitored Anesthesia Care Procedure:                Pre-Anesthesia Assessment:                           - Prior to the procedure, a History and Physical                            was performed, and patient medications and                            allergies were reviewed. The patient's tolerance of                            previous anesthesia was also reviewed. The risks                            and benefits of the procedure and the sedation                            options and risks were discussed with the patient.                            All questions were answered, and informed consent                            was obtained. Prior Anticoagulants: The patient has                            taken no anticoagulant or antiplatelet agents. ASA                            Grade Assessment: II - A patient with mild systemic                            disease. After reviewing the risks and benefits,                            the patient was deemed in satisfactory condition to                            undergo the procedure.                           After obtaining informed consent, the colonoscope  was passed under direct vision. Throughout the                            procedure, the patient's blood pressure, pulse, and                            oxygen saturations were monitored continuously. The                            CF [id number] #[id number] was introduced through the anus                            and advanced to the the terminal ileum. The                            colonoscopy was performed without  difficulty. The                            patient tolerated the procedure well. The quality                            of the bowel preparation was excellent. The                            terminal ileum, ileocecal valve, appendiceal                            orifice, and rectum were photographed. Scope In: 10:17:43 AM Scope Out: 10:30:59 AM Scope Withdrawal Time: 0 hours 9 minutes 25 seconds  Total Procedure Duration: 0 hours 13 minutes 16 seconds  Findings:                 The terminal ileum appeared normal.                           A few small-mouthed diverticula were found in the                            sigmoid colon.                           Non-bleeding internal hemorrhoids were found during                            retroflexion.                           Biopsies for histology were taken with a cold                            forceps from the entire colon for evaluation of                            microscopic colitis. Complications:            No immediate complications. Estimated Blood Loss:  Estimated blood loss was minimal. Impression:               - The examined portion of the ileum was normal.                           - Diverticulosis in the sigmoid colon.                           - Non-bleeding internal hemorrhoids.                           - Biopsies were taken with a cold forceps from the                            entire colon for evaluation of microscopic colitis. Recommendation:           - Discharge patient to home (with escort).                           - Await pathology results.                           - Return to GI clinic in 2-3 months.                           - The findings and recommendations were discussed                            with the patient. Dr Particia Lather "[place]" [place],  02/11/2023 10:36:27 AM

## 2023-02-11 NOTE — Patient Instructions (Signed)
YOU HAD AN ENDOSCOPIC PROCEDURE TODAY AT THE [facility]:   Refer to the procedure report that was given to you for any specific questions about what was found during the examination.  If the procedure report does not answer your questions, please call your gastroenterologist to clarify.  If you requested that your care partner not be given the details of your procedure findings, then the procedure report has been included in a sealed envelope for you to review at your convenience later.  YOU SHOULD EXPECT: Some feelings of bloating in the abdomen. Passage of more gas than usual.  Walking can help get rid of the air that was put into your GI tract during the procedure and reduce the bloating. If you had a lower endoscopy (such as a colonoscopy or flexible sigmoidoscopy) you may notice spotting of blood in your stool or on the toilet paper. If you underwent a bowel prep for your procedure, you may not have a normal bowel movement for a few days.  Please Note:  You might notice some irritation and congestion in your nose or some drainage.  This is from the oxygen used during your procedure.  There is no need for concern and it should clear up in a day or so.  SYMPTOMS TO REPORT IMMEDIATELY:  Following lower endoscopy (colonoscopy or flexible sigmoidoscopy):  Excessive amounts of blood in the stool  Significant tenderness or worsening of abdominal pains  Swelling of the abdomen that is new, acute  Fever of 100F or higher    For urgent or emergent issues, a gastroenterologist can be reached at any hour by calling [phone]. Do not use MyChart messaging for urgent concerns.    DIET:  We do recommend a small meal at first, but then you may proceed to your regular diet.  Drink plenty of fluids but you should avoid alcoholic beverages for 24 hours.  MEDICATIONS: Continue present medications.  Please see handouts given to you by your recovery nurse: Diverticulitis,  Hemorrhoids.  FOLLOW UP: Await pathology results. Follow with Dr. Leonides Schanz in the GI clinic in 2-3 months (diarrhea). Appointment scheduled prior to discharge.  Thank you for allowing [place] to provide for your healthcare needs today   ACTIVITY:  You should plan to take it easy for the rest of today and you should NOT DRIVE or use heavy machinery until tomorrow (because of the sedation medicines used during the test).    FOLLOW UP: Our staff will call the number listed on your records the next business day following your procedure.  We will call around 7:15- 8:00 am to check on you and address any questions or concerns that you may have regarding the information given to you following your procedure. If we do not reach you, we will leave a message.     If any biopsies were taken you will be contacted by phone or by letter within the next 1-3 weeks.  Please call us at [phone] if you have not heard about the biopsies in 3 weeks.    SIGNATURES/CONFIDENTIALITY: You and/or your care partner have signed paperwork which will be entered into your electronic medical record.  These signatures attest to the fact that that the information above on your After Visit Summary has been reviewed and is understood.  Full responsibility of the confidentiality of this discharge information lies with you and/or your care-partner.

## 2023-02-11 NOTE — Progress Notes (Signed)
Uneventful anesthetic. Report to pacu rn. Vss. Care resumed by rn. 

## 2023-02-11 NOTE — Progress Notes (Signed)
Pt's states no medical or surgical changes since previsit or office visit. 

## 2023-02-11 NOTE — Progress Notes (Signed)
Called to room to assist during endoscopic procedure.  Patient ID and intended procedure confirmed with present staff. Received instructions for my participation in the procedure from the performing physician.  

## 2023-02-14 ENCOUNTER — Telehealth: Payer: Self-pay | Admitting: *Deleted

## 2023-02-14 NOTE — Telephone Encounter (Signed)
NO answer for post procedure call back. Left VM. 

## 2023-02-16 ENCOUNTER — Encounter: Payer: Self-pay | Admitting: Internal Medicine

## 2023-02-17 DIAGNOSIS — H5203 Hypermetropia, bilateral: Secondary | ICD-10-CM | POA: Diagnosis not present

## 2023-03-08 DIAGNOSIS — Z419 Encounter for procedure for purposes other than remedying health state, unspecified: Secondary | ICD-10-CM | POA: Diagnosis not present

## 2023-03-09 DIAGNOSIS — F411 Generalized anxiety disorder: Secondary | ICD-10-CM | POA: Diagnosis not present

## 2023-03-09 DIAGNOSIS — F331 Major depressive disorder, recurrent, moderate: Secondary | ICD-10-CM | POA: Diagnosis not present

## 2023-03-10 ENCOUNTER — Telehealth: Payer: Self-pay | Admitting: Internal Medicine

## 2023-03-10 NOTE — Telephone Encounter (Signed)
Inbound call from patient returning phone call. Patient requesting a call back. Please advise, thank you.

## 2023-03-10 NOTE — Telephone Encounter (Signed)
Inbound call from patient requesting a call to discuss 9/6 colonoscopy results. Please advise, thank you.

## 2023-03-10 NOTE — Telephone Encounter (Signed)
Called the patient back at the given phone number. No answer. Left a message of my call.

## 2023-03-11 NOTE — Telephone Encounter (Signed)
Inbound call from patient, following up on call below.

## 2023-03-15 NOTE — Telephone Encounter (Signed)
Called the patient. No answer. Left a message of my call.

## 2023-03-15 NOTE — Telephone Encounter (Signed)
Spoke with the patient. She did not receive her letter for her pathology. Read the letter to her. Invited questions. No questions. Letter printed to mail. Instructed the patient on how to see her letter in her My Chart.

## 2023-03-25 DIAGNOSIS — Z23 Encounter for immunization: Secondary | ICD-10-CM | POA: Diagnosis not present

## 2023-03-25 DIAGNOSIS — E785 Hyperlipidemia, unspecified: Secondary | ICD-10-CM | POA: Diagnosis not present

## 2023-03-25 DIAGNOSIS — K529 Noninfective gastroenteritis and colitis, unspecified: Secondary | ICD-10-CM | POA: Diagnosis not present

## 2023-03-25 DIAGNOSIS — F419 Anxiety disorder, unspecified: Secondary | ICD-10-CM | POA: Diagnosis not present

## 2023-03-25 DIAGNOSIS — R140 Abdominal distension (gaseous): Secondary | ICD-10-CM | POA: Diagnosis not present

## 2023-03-25 DIAGNOSIS — F331 Major depressive disorder, recurrent, moderate: Secondary | ICD-10-CM | POA: Diagnosis not present

## 2023-03-25 DIAGNOSIS — R7303 Prediabetes: Secondary | ICD-10-CM | POA: Diagnosis not present

## 2023-04-14 ENCOUNTER — Encounter: Payer: Self-pay | Admitting: Internal Medicine

## 2023-04-14 ENCOUNTER — Ambulatory Visit: Payer: Medicare HMO | Admitting: Internal Medicine

## 2023-04-14 VITALS — BP 124/70 | HR 85 | Ht 63.0 in | Wt 141.0 lb

## 2023-04-14 DIAGNOSIS — R1012 Left upper quadrant pain: Secondary | ICD-10-CM

## 2023-04-14 DIAGNOSIS — K529 Noninfective gastroenteritis and colitis, unspecified: Secondary | ICD-10-CM

## 2023-04-14 NOTE — Patient Instructions (Addendum)
You have been given a testing kit to check for small intestine bacterial overgrowth (SIBO) which is completed by a company named [facility]. Make sure to return your test in the mail using the return mailing label given to you along with the kit. The test order, your demographic and insurance information have all already been sent to the company. [facility] will collect an upfront charge of $99.74 for commercial insurance plans and $209.74 if you are paying cash. Make sure to discuss with [facility] PRIOR to having the test to see if they have gotten information from your insurance company as to how much your testing will cost out of pocket, if any. Please contact [facility] at phone number [phone] to get instructions regarding how to perform the test as our office is unable to give specific testing instructions.  Start taking Benefiber daily  Increase Imodium up to 16 mg   You are scheduled for a follow up visit on 07/18/23 at 11:10 am _______________________________________________________  If your blood pressure at your visit was 140/90 or greater, please contact your primary care physician to follow up on this.  _______________________________________________________  If you are age 71 or older, your body mass index should be between 23-30. Your Body mass index is 24.98 kg/m. If this is out of the aforementioned range listed, please consider follow up with your Primary Care Provider.  If you are age 67 or younger, your body mass index should be between 19-25. Your Body mass index is 24.98 kg/m. If this is out of the aformentioned range listed, please consider follow up with your Primary Care Provider.   ________________________________________________________  The [facility] providers would like to encourage you to use [facility] to communicate with providers for non-urgent requests or questions.  Due to long hold times on the telephone, sending your provider a message  by [facility] may be a faster and more efficient way to get a response.  Please allow 48 business hours for a response.  Please remember that this is for non-urgent requests.  _______________________________________________________  Thank you for entrusting me with your care and for choosing [facility], Dr. Eulah Pont

## 2023-04-14 NOTE — Progress Notes (Signed)
04/14/2023 Hayley Dixon [id number] 1956-04-10   CHIEF COMPLAINT: Diarrhea   HISTORY OF PRESENT ILLNESS: Hayley Dixon is a 67 year old female with a past medical history of arthritis, anxiety, depression and hyperlipidemia presents for follow up of diarrhea.  She has had diarrhea for at least a year.  Interval History: Imodium twice daily doesn't help with her diarrhea. She is having 10 BMs per day on average. Denies nocturnal stools. Diarrhea is kind of mucousy. She still has her gallbladder. Denies prior issues with pancreas. Denies prior bowel surgeries. Endorses bloating issues.  Low FODMAP diet has not worked in the past. She has occasoinally LUQ ab pain, not clear what provokes this.     Latest Ref Rng & Units 12/20/2022    9:18 AM 09/21/2022   12:17 PM 02/24/2022   11:23 AM  CBC  WBC 4.0 - 10.5 K/uL 11.7  10.3  CANCELED   Hemoglobin 12.0 - 15.0 g/dL 64.4  03.4  CANCELED   Hematocrit 36.0 - 46.0 % 40.7  43.6  CANCELED   Platelets 150.0 - 400.0 K/uL 316.0  329  CANCELED        Latest Ref Rng & Units 12/20/2022    9:18 AM 09/21/2022   12:17 PM 02/24/2022   11:23 AM  CMP  Glucose 70 - 99 mg/dL 92  94  742   BUN 6 - 23 mg/dL 9  15  15    Creatinine 0.40 - 1.20 mg/dL 5.95  6.38  7.56   Sodium 135 - 145 mEq/L 137  134  133   Potassium 3.5 - 5.1 mEq/L 3.8  4.1  4.4   Chloride 96 - 112 mEq/L 107  101  93   CO2 19 - 32 mEq/L 22  15  19    Calcium 8.4 - 10.5 mg/dL 9.5  9.5  43.3   Total Protein 6.0 - 8.3 g/dL 6.7  7.1  7.2   Total Bilirubin 0.2 - 1.2 mg/dL 0.4  0.3  <2.9   Alkaline Phos 39 - 117 U/L 72  100  100   AST 0 - 37 U/L 24  22  18    ALT 0 - 35 U/L 29  33  19     Social History: She is divorced.  She has 1 son.  Retired.  She smokes 10 cigarettes x 40+ years. No alcohol use. No drug use.   Family History: Mother had lung and bone cancer. Father had heart disease.  2 maternal cousins with history of celiac disease.  No known family history of esophageal, gastric,  colon, liver or pancreatic cancer.  No Known Allergies    Outpatient Encounter Medications as of 04/14/2023  Medication Sig   ALPRAZolam (XANAX) 0.5 MG tablet Take 0.5 mg by mouth 2 (two) times daily as needed for anxiety.   atorvastatin (LIPITOR) 10 MG tablet Take 1 tablet(s) every day by oral route for 90 days.   buPROPion (WELLBUTRIN XL) 150 MG 24 hr tablet Take 150 mg by mouth every morning.   busPIRone (BUSPAR) 15 MG tablet Take 15 mg by mouth 3 (three) times daily.   hydrOXYzine (ATARAX) 25 MG tablet Take 25 mg by mouth 2 (two) times daily.   PARoxetine (PAXIL) 40 MG tablet Take 40 mg by mouth every morning.   pregabalin (LYRICA) 200 MG capsule Take 200 mg by mouth 2 (two) times daily as needed.   Facility-Administered Encounter Medications as of 04/14/2023  Medication   0.9 %  sodium  chloride infusion   PHYSICAL EXAM: BP 124/70   Pulse 85   Ht 5\' 3"  (1.6 m)   Wt 141 lb (64 kg)   BMI 24.98 kg/m   Wt Readings from Last 3 Encounters:  04/14/23 141 lb (64 kg)  02/11/23 140 lb (63.5 kg)  01/27/23 140 lb (63.5 kg)    General: Female in no acute distress. Head: Normocephalic and atraumatic. Lungs: Clear bilaterally to auscultation without wheezes, crackles or rhonchi. Heart: Regular rate and rhythm. No murmur, rub or gallop appreciated.  Abdomen: Soft, nontender, nondistended Musculoskeletal: Symmetrical with no gross deformities. Skin: Warm and dry. No rash or lesions on visible extremities. Extremities: No edema. Neurological: Alert oriented x 4, no focal deficits.  Psychological:  Alert and cooperative. Normal mood and affect.  Labs 09/2022: Stool cultures, O&P and C. difficile toxin A and B were negative. H. Pylori stool antigen negative  Labs 12/2022: CBC with elevated WBC of 11.7.  CMP normal.  TSH normal.  CRP normal.  tTG IgA negative.  IgA normal.  ESR normal.  Colonoscopy 02/11/23: - The examined portion of the ileum was normal. - Diverticulosis in the sigmoid  colon. - Non- bleeding internal hemorrhoids. - Biopsies were taken with a cold forceps from the entire colon for evaluation of microscopic colitis. Path: Surgical [P], colon nos, random sites - COLONIC MUCOSA WITH NO SPECIFIC HISTOPATHOLOGIC CHANGES - NEGATIVE FOR ACUTE INFLAMMATION, INCREASED INTRAEPITHELIAL LYMPHOCYTES OR THICKENED SUBEPITHELIAL COLLAGEN TABLE  ASSESSMENT AND PLAN:  Chronic diarrhea LUQ ab pain Previous workup has not been revealing for source of her diarrhea.  She had negative infectious stool studies, negative celiac panel and inflammatory markers.  Her colonoscopy showed normal colon biopsies that ruled out microscopic colitis.  Imodium twice daily has not been adequate to control her symptoms so I have asked her to increase her Imodium use if possible.  Patient also did not start her prior Benefiber so we will have her start it today.  Will also plan for SIBO breath test to see if this may be contributing to her symptoms. - Use Imodium up to 16 mg  - SIBO breath test - Start daily Benefiber - Could try colestipol or rifaximin in the future - RTC 2 months  I spent 32 minutes of time, including in depth chart review, independent review of results as outlined above, communicating results with the patient directly, face-to-face time with the patient, coordinating care, and ordering studies and medications as appropriate, and documentation.

## 2023-04-15 ENCOUNTER — Telehealth: Payer: Self-pay

## 2023-04-15 NOTE — Telephone Encounter (Signed)
The patient contacted her insurance to inquire if she has coverage for an SIBO test through [facility]. [employer] told her she would need the test authorized.  CPT code is 91065 Diarrhea  K52.9 LUQ pain R10.12

## 2023-04-15 NOTE — Telephone Encounter (Signed)
Patient contacted and notified no precert required for this service [phone]

## 2023-04-19 DIAGNOSIS — K529 Noninfective gastroenteritis and colitis, unspecified: Secondary | ICD-10-CM | POA: Diagnosis not present

## 2023-04-19 DIAGNOSIS — R1012 Left upper quadrant pain: Secondary | ICD-10-CM | POA: Diagnosis not present

## 2023-05-02 DIAGNOSIS — F331 Major depressive disorder, recurrent, moderate: Secondary | ICD-10-CM | POA: Diagnosis not present

## 2023-05-02 DIAGNOSIS — F411 Generalized anxiety disorder: Secondary | ICD-10-CM | POA: Diagnosis not present

## 2023-05-09 NOTE — Telephone Encounter (Signed)
Patient calling in regards to sibo test. Please advise.

## 2023-05-10 NOTE — Telephone Encounter (Signed)
Spoke with the patient. She is asking for her results from the SIBO test. No results in her chart. I will call the lab to inquire.

## 2023-05-11 NOTE — Telephone Encounter (Signed)
[facility] is re-faxing the results to [phone]

## 2023-05-12 ENCOUNTER — Other Ambulatory Visit: Payer: Self-pay

## 2023-05-12 MED ORDER — RIFAXIMIN 550 MG PO TABS
550.0000 mg | ORAL_TABLET | Freq: Three times a day (TID) | ORAL | 0 refills | Status: AC
Start: 2023-05-12 — End: 2023-05-26

## 2023-05-12 MED ORDER — NEOMYCIN SULFATE 500 MG PO TABS: 500.0000 mg | ORAL_TABLET | Freq: Two times a day (BID) | ORAL | 0 refills | Status: AC

## 2023-05-12 NOTE — Telephone Encounter (Signed)
Received the results of patient's SIBO breath test.  Results 04/19/2023 show increase in hydrogen of 136 (normal less than 20), increase in methane of 21 (normal less than 12), increasing combined hydrogen and methane 157 bpm (less than 15 is normal).  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 Recommend treatment with neomycin 500 mg BID and rifaximin 550 mg TID for 14 days. Beth, please let her know about these results and send her this treatment. Will have results scanned into the patient's chart

## 2023-05-12 NOTE — Telephone Encounter (Signed)
Patient contacted and advised of her results. Prescriptions to her chosen pharmacy.

## 2023-05-16 ENCOUNTER — Telehealth: Payer: Self-pay | Admitting: Internal Medicine

## 2023-05-16 ENCOUNTER — Telehealth: Payer: Self-pay

## 2023-05-16 DIAGNOSIS — R1012 Left upper quadrant pain: Secondary | ICD-10-CM

## 2023-05-16 MED ORDER — METRONIDAZOLE 250 MG PO TABS: 250.0000 mg | ORAL_TABLET | Freq: Three times a day (TID) | ORAL | 0 refills | Status: AC

## 2023-05-16 NOTE — Telephone Encounter (Signed)
Inbound call from patient, states Hayley Dixon is not covered with her medicare and is too costly. Patient is requesting more affordable alternative. Please advise.

## 2023-05-16 NOTE — Telephone Encounter (Signed)
Called patient no answer left message on voice mail regarding another medication alternative per provider sent Flagyl 250 mg 3 times daily for 10 days.

## 2023-05-26 NOTE — Telephone Encounter (Addendum)
Inbound call from patient, states the medication is not working. Patient is requesting a different medication.

## 2023-05-27 ENCOUNTER — Other Ambulatory Visit: Payer: Self-pay

## 2023-05-27 ENCOUNTER — Telehealth: Payer: Self-pay

## 2023-05-27 DIAGNOSIS — K529 Noninfective gastroenteritis and colitis, unspecified: Secondary | ICD-10-CM

## 2023-05-27 MED ORDER — COLESTIPOL HCL 1 G PO TABS: 2.0000 g | ORAL_TABLET | Freq: Every day | ORAL | 1 refills | Status: DC

## 2023-05-27 NOTE — Telephone Encounter (Signed)
Pharmacy Patient Advocate Encounter   Received notification from CoverMyMeds that prior authorization for Xifaxan 550 mg tablet is required/requested.   Insurance verification completed.   The patient is insured through  [employer]  .   Per test claim: PA required; PA submitted to above mentioned insurance via CoverMyMeds Key/confirmation #/EOC [id number] Status is pending

## 2023-06-06 ENCOUNTER — Other Ambulatory Visit (HOSPITAL_COMMUNITY): Payer: Self-pay

## 2023-06-06 NOTE — Telephone Encounter (Signed)
Pharmacy Patient Advocate Encounter  Received notification from [facility] [employer]  that Prior Authorization for Xifaxan 550MG  tablets has been APPROVED from 05-27-2023 to 06-10-2023. Ran test claim, Copay is $742.55. Quantity of 42 tablets per 14 days. This test claim was processed through [facility]- copay amounts may vary at other pharmacies due to pharmacy/plan contracts, or as the patient moves through the different stages of their insurance plan.   PA #/Case ID/Reference #: [id number]

## 2023-06-24 DIAGNOSIS — F411 Generalized anxiety disorder: Secondary | ICD-10-CM | POA: Diagnosis not present

## 2023-06-24 DIAGNOSIS — F331 Major depressive disorder, recurrent, moderate: Secondary | ICD-10-CM | POA: Diagnosis not present

## 2023-07-18 ENCOUNTER — Ambulatory Visit: Payer: Medicare HMO | Admitting: Internal Medicine

## 2023-07-20 ENCOUNTER — Other Ambulatory Visit: Payer: Self-pay | Admitting: Internal Medicine

## 2023-07-22 ENCOUNTER — Other Ambulatory Visit: Payer: Self-pay | Admitting: Nurse Practitioner

## 2023-08-05 ENCOUNTER — Telehealth: Payer: Self-pay | Admitting: Internal Medicine

## 2023-08-05 NOTE — Telephone Encounter (Signed)
 Patient called to request a script for Colestipol so she can get the medication from [place] because it would be cheaper. Please advise.

## 2023-08-08 MED ORDER — XIFAXAN 550 MG PO TABS
550.0000 mg | ORAL_TABLET | Freq: Three times a day (TID) | ORAL | 0 refills | Status: DC
Start: 2023-08-08 — End: 2023-08-09

## 2023-08-08 NOTE — Telephone Encounter (Signed)
 I need details. Does she want to pick up a printed prescription, or does she have the name  and address of the pharmacy? Called the patient to discuss. No answer. Left her a message of my call.

## 2023-08-08 NOTE — Telephone Encounter (Addendum)
 Spoke with the patient. She will pick up the written prescription to go to the chosen pharmacy.  Patient is able to purchase 50 tabs of  Xifaxan 550 mg to take 1 PO TID x 14 days. The pharmacy will not "break a pack." Advised the patient she will need to dispose of 8 tablets in order to be on the correct treatment. Expresses understanding. The [place] cost of the medication was going to be over $800.00 which she is unable to afford.

## 2023-08-08 NOTE — Telephone Encounter (Signed)
 Patient called stated she was previously recommended Xifaxan medication and that's what she would like to get from [place].

## 2023-08-09 ENCOUNTER — Other Ambulatory Visit: Payer: Self-pay

## 2023-08-09 ENCOUNTER — Other Ambulatory Visit: Payer: Self-pay | Admitting: Physician Assistant

## 2023-08-09 MED ORDER — XIFAXAN 550 MG PO TABS: 550.0000 mg | ORAL_TABLET | Freq: Three times a day (TID) | ORAL | 0 refills | Status: DC

## 2023-08-17 DIAGNOSIS — F331 Major depressive disorder, recurrent, moderate: Secondary | ICD-10-CM | POA: Diagnosis not present

## 2023-08-17 DIAGNOSIS — F411 Generalized anxiety disorder: Secondary | ICD-10-CM | POA: Diagnosis not present

## 2023-08-19 NOTE — Progress Notes (Deleted)
 Chief Complaint:follow-up Primary GI Doctor:Dr. Leonides Schanz   HPI:  Hayley Dixon is a 68 year old female with a past medical history of arthritis, anxiety, depression and hyperlipidemia presents for follow up of diarrhea.   She has had diarrhea for at least a year.  04/14/23 patient last seen by Dr. Leonides Schanz for diarrhea. She increased imodium to up to 16 mg per day. Start daily fiber.   04/19/23 Sibo breat test. Positive for intestinal methane overgrowth. Recommended neomycin and rifaximin for 14 days.  Rifaximin not covered. Flagyl 250 mg TID for 10 days sent.***    Interval History  Patient admits/denies GERD Patient admits/denies dysphagia Patient admits/denies nausea, vomiting, or weight loss  Patient admits/denies altered bowel habits Patient admits/denies abdominal pain Patient admits/denies rectal bleeding   Wt Readings from Last 3 Encounters:  04/14/23 141 lb (64 kg)  02/11/23 140 lb (63.5 kg)  01/27/23 140 lb (63.5 kg)    Past Medical History:  Diagnosis Date   Anxiety    Arthritis    Depression    HLD (hyperlipidemia)     Past Surgical History:  Procedure Laterality Date   COLONOSCOPY     TONSILLECTOMY      Current Outpatient Medications  Medication Sig Dispense Refill   ALPRAZolam (XANAX) 0.5 MG tablet Take 0.5 mg by mouth 2 (two) times daily as needed for anxiety.     atorvastatin (LIPITOR) 10 MG tablet TAKE 1 TABLET BY MOUTH DAILY 90 tablet 2   buPROPion (WELLBUTRIN XL) 150 MG 24 hr tablet Take 150 mg by mouth every morning.     busPIRone (BUSPAR) 15 MG tablet Take 15 mg by mouth 3 (three) times daily.     colestipol (COLESTID) 1 g tablet TAKE 2 TABLETS BY MOUTH DAILY 60 tablet 1   hydrOXYzine (ATARAX) 25 MG tablet Take 25 mg by mouth 2 (two) times daily.     PARoxetine (PAXIL) 40 MG tablet Take 40 mg by mouth every morning.     pregabalin (LYRICA) 200 MG capsule Take 200 mg by mouth 2 (two) times daily as needed.     XIFAXAN 550 MG TABS tablet Take 1  tablet (550 mg total) by mouth 3 (three) times daily. As directed 50 tablet 0   Current Facility-Administered Medications  Medication Dose Route Frequency Provider Last Rate Last Admin   0.9 %  sodium chloride infusion  500 mL Intravenous Once Imogene Burn, MD        Allergies as of 08/22/2023   (No Known Allergies)    Family History  Problem Relation Age of Onset   Cancer Mother        died of lung and bone cancer   Heart disease Father    Hypertension Father    Lupus Brother    Esophageal cancer Brother    Hypertension Brother    Throat cancer Brother    Suicidality Brother    Breast cancer Maternal Grandmother    Colon cancer Neg Hx    Colon polyps Neg Hx    Rectal cancer Neg Hx    Stomach cancer Neg Hx     Review of Systems:    Constitutional: No weight loss, fever, chills, weakness or fatigue HEENT: Eyes: No change in vision               Ears, Nose, Throat:  No change in hearing or congestion Skin: No rash or itching Cardiovascular: No chest pain, chest pressure or palpitations   Respiratory: No  SOB or cough Gastrointestinal: See HPI and otherwise negative Genitourinary: No dysuria or change in urinary frequency Neurological: No headache, dizziness or syncope Musculoskeletal: No new muscle or joint pain Hematologic: No bleeding or bruising Psychiatric: No history of depression or anxiety    Physical Exam:  Vital signs: There were no vitals taken for this visit.  Constitutional:   Pleasant *** female appears to be in NAD, Well developed, Well nourished, alert and cooperative Throat: Oral cavity and pharynx without inflammation, swelling or lesion.  Respiratory: Respirations even and unlabored. Lungs clear to auscultation bilaterally.   No wheezes, crackles, or rhonchi.  Cardiovascular: Normal S1, S2. Regular rate and rhythm. No peripheral edema, cyanosis or pallor.  Gastrointestinal:  Soft, nondistended, nontender. No rebound or guarding. Normal bowel  sounds. No appreciable masses or hepatomegaly. Rectal:  Not performed.  Anoscopy: Msk:  Symmetrical without gross deformities. Without edema, no deformity or joint abnormality.  Neurologic:  Alert and  oriented x4;  grossly normal neurologically.  Skin:   Dry and intact without significant lesions or rashes. Psychiatric: Oriented to person, place and time. Demonstrates good judgement and reason without abnormal affect or behaviors.  RELEVANT LABS AND IMAGING: CBC    Latest Ref Rng & Units 12/20/2022    9:18 AM 09/21/2022   12:17 PM 02/24/2022   11:23 AM  CBC  WBC 4.0 - 10.5 K/uL 11.7  10.3  CANCELED   Hemoglobin 12.0 - 15.0 g/dL 29.5  28.4  CANCELED   Hematocrit 36.0 - 46.0 % 40.7  43.6  CANCELED   Platelets 150.0 - 400.0 K/uL 316.0  329  CANCELED      CMP     Latest Ref Rng & Units 12/20/2022    9:18 AM 09/21/2022   12:17 PM 02/24/2022   11:23 AM  CMP  Glucose 70 - 99 mg/dL 92  94  132   BUN 6 - 23 mg/dL 9  15  15    Creatinine 0.40 - 1.20 mg/dL 4.40  1.02  7.25   Sodium 135 - 145 mEq/L 137  134  133   Potassium 3.5 - 5.1 mEq/L 3.8  4.1  4.4   Chloride 96 - 112 mEq/L 107  101  93   CO2 19 - 32 mEq/L 22  15  19    Calcium 8.4 - 10.5 mg/dL 9.5  9.5  36.6   Total Protein 6.0 - 8.3 g/dL 6.7  7.1  7.2   Total Bilirubin 0.2 - 1.2 mg/dL 0.4  0.3  <4.4   Alkaline Phos 39 - 117 U/L 72  100  100   AST 0 - 37 U/L 24  22  18    ALT 0 - 35 U/L 29  33  19      Lab Results  Component Value Date   TSH 1.67 12/20/2022    Labs 09/2022: Stool cultures, O&P and C. difficile toxin A and B were negative. H. Pylori stool antigen negative   Labs 12/2022: CBC with elevated WBC of 11.7.  CMP normal.  TSH normal.  CRP normal.  tTG IgA negative.  IgA normal.  ESR normal.   Colonoscopy 02/11/23: - The examined portion of the ileum was normal. - Diverticulosis in the sigmoid colon. - Non- bleeding internal hemorrhoids. - Biopsies were taken with a cold forceps from the entire colon for evaluation of  microscopic colitis. Path: Surgical [P], colon nos, random sites - COLONIC MUCOSA WITH NO SPECIFIC HISTOPATHOLOGIC CHANGES - NEGATIVE FOR ACUTE INFLAMMATION, INCREASED INTRAEPITHELIAL  LYMPHOCYTES OR THICKENED SUBEPITHELIAL COLLAGEN TABLE  Assessment: 1. ***  Plan: -Consider trial of colestipol or refaximin.   Thank you for the courtesy of this consult. Please call me with any questions or concerns.   Ikeem Cleckler, FNP-C [facility] 08/19/2023, 4:07 PM  Cc: Charlane Ferretti, DO

## 2023-08-22 ENCOUNTER — Ambulatory Visit: Payer: Medicare HMO | Admitting: Gastroenterology

## 2023-08-25 ENCOUNTER — Telehealth: Payer: Self-pay | Admitting: Internal Medicine

## 2023-08-25 ENCOUNTER — Other Ambulatory Visit: Payer: Self-pay

## 2023-08-25 MED ORDER — RIFAXIMIN 550 MG PO TABS: 550.0000 mg | ORAL_TABLET | Freq: Three times a day (TID) | ORAL | Status: DC

## 2023-08-25 MED ORDER — RIFAXIMIN 550 MG PO TABS: 550.0000 mg | ORAL_TABLET | Freq: Three times a day (TID) | ORAL | 0 refills | Status: DC

## 2023-08-25 NOTE — Telephone Encounter (Signed)
Let me know if you need anything on this.

## 2023-08-25 NOTE — Telephone Encounter (Signed)
 Inbound call from  [facility]. Com    stating that patient is requesting generic brand of Xifaxan. They stated that they have faxed over request already.  Requesting a call back to discuss. Please advise.   Call back number [phone]  Ext [phone]

## 2023-08-30 ENCOUNTER — Telehealth: Payer: Self-pay

## 2023-08-30 NOTE — Telephone Encounter (Signed)
 Called patient regarding Xifaxan faxed prescription to [facility] pharmacy on 08/25/23 wanted to know if patient received medication was unable to speak to someone directly at the pharmacy phone call was routed to voicemail.  Left message on patient voicemail to call office back.

## 2023-08-30 NOTE — Telephone Encounter (Signed)
 Inbound call from patient stating she needed generic version of Xifaxan. Requesting a call back. Please advise, thank you.

## 2023-09-14 DIAGNOSIS — E785 Hyperlipidemia, unspecified: Secondary | ICD-10-CM | POA: Diagnosis not present

## 2023-09-18 ENCOUNTER — Other Ambulatory Visit: Payer: Self-pay | Admitting: Internal Medicine

## 2023-10-11 DIAGNOSIS — F331 Major depressive disorder, recurrent, moderate: Secondary | ICD-10-CM | POA: Diagnosis not present

## 2023-10-11 DIAGNOSIS — F411 Generalized anxiety disorder: Secondary | ICD-10-CM | POA: Diagnosis not present

## 2023-11-01 ENCOUNTER — Other Ambulatory Visit: Payer: Self-pay

## 2023-11-01 ENCOUNTER — Telehealth: Payer: Self-pay | Admitting: Internal Medicine

## 2023-11-01 MED ORDER — RIFAXIMIN 550 MG PO TABS: 550.0000 mg | ORAL_TABLET | Freq: Three times a day (TID) | ORAL | 0 refills | Status: AC

## 2023-11-01 NOTE — Telephone Encounter (Signed)
 Inbound call to get a refill on Xifaxin. It needs to be sent to [facility] in [place]. Please adivse.

## 2023-12-02 ENCOUNTER — Other Ambulatory Visit: Payer: Self-pay | Admitting: Internal Medicine

## 2024-01-03 DIAGNOSIS — F331 Major depressive disorder, recurrent, moderate: Secondary | ICD-10-CM | POA: Diagnosis not present

## 2024-01-03 DIAGNOSIS — F411 Generalized anxiety disorder: Secondary | ICD-10-CM | POA: Diagnosis not present

## 2024-01-03 DIAGNOSIS — R300 Dysuria: Secondary | ICD-10-CM | POA: Diagnosis not present

## 2024-01-04 DIAGNOSIS — R300 Dysuria: Secondary | ICD-10-CM | POA: Diagnosis not present

## 2024-02-04 ENCOUNTER — Other Ambulatory Visit: Payer: Self-pay | Admitting: Internal Medicine

## 2024-02-22 IMAGING — MR MR LUMBAR SPINE W/O CM
4 of 5 series · 25 of 48 positions shown · non-contrast
Comparison: None Available.

CLINICAL DATA: 65-year-old female with low back pain
intermittently. Symptoms exacerbated by bending.

EXAM:
MRI LUMBAR SPINE WITHOUT CONTRAST
TECHNIQUE: Multiplanar, multisequence MR imaging of the lumbar spine was
performed. No intravenous contrast was administered.

[Series 2: T2 · sagittal · 4.0mm · 0.53mm/px · 6 of 15 slices shown (1 of 2)]
[im 1/15]
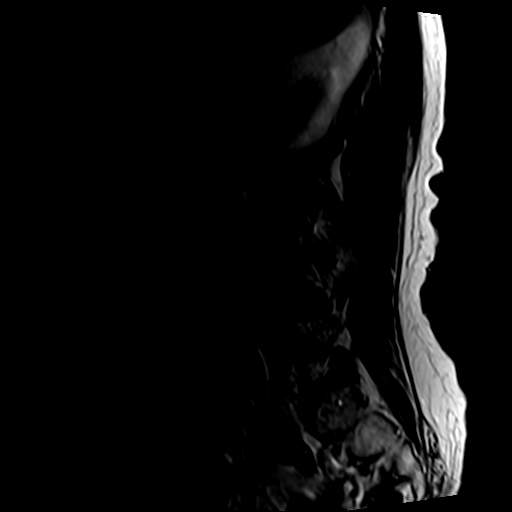
[im 3/15]
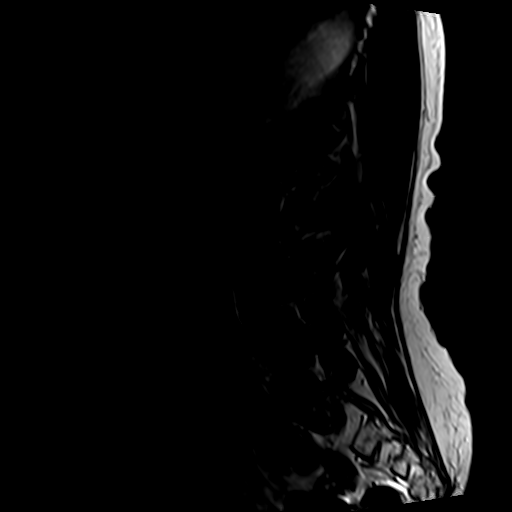
[im 6/15]
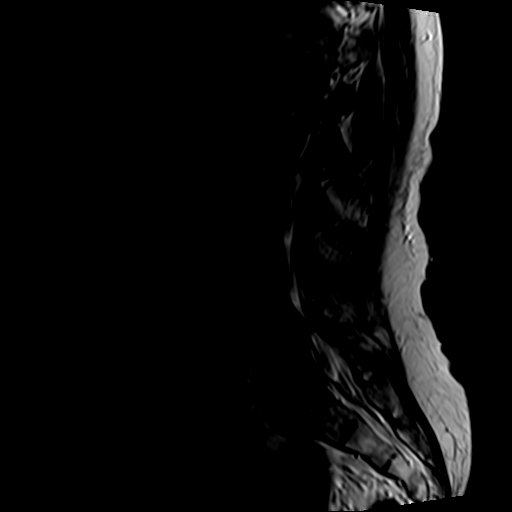
[im 9/15]
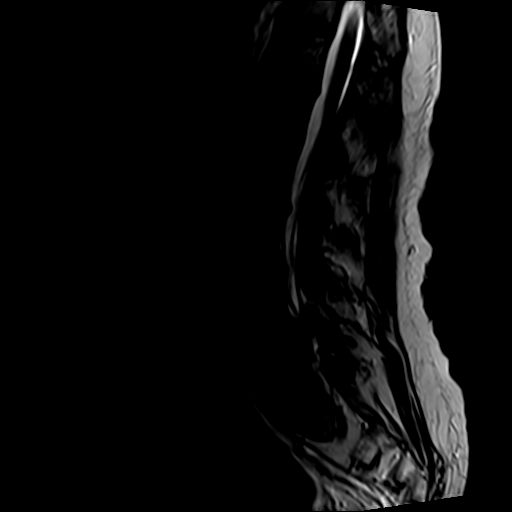
[im 12/15]
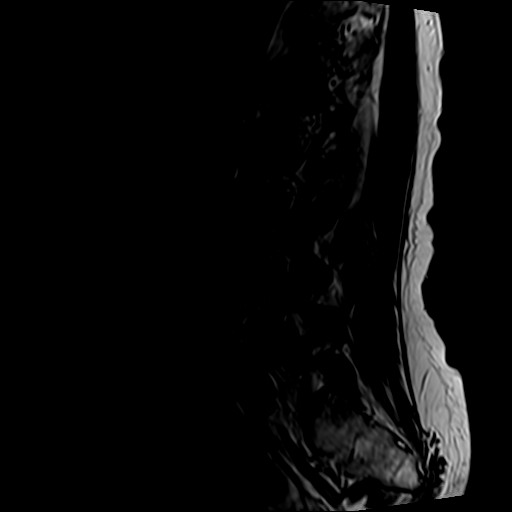
[im 15/15]
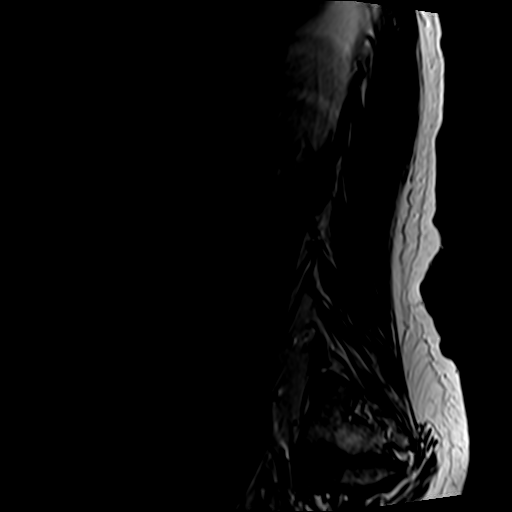

[Series 4: T1 · sagittal · 4.0mm · 0.53mm/px · 6 of 15 slices shown (1 of 2)]
[im 1/15]
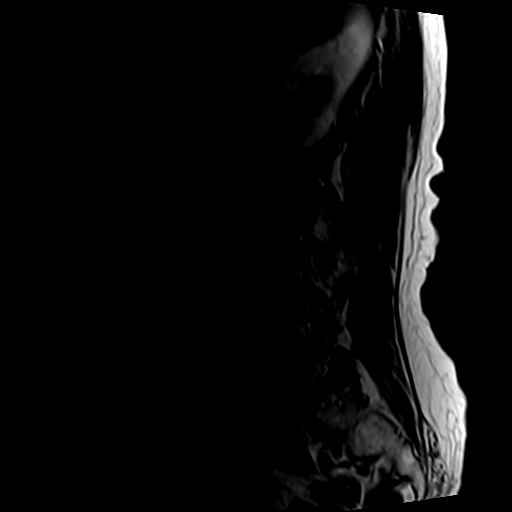
[im 3/15]
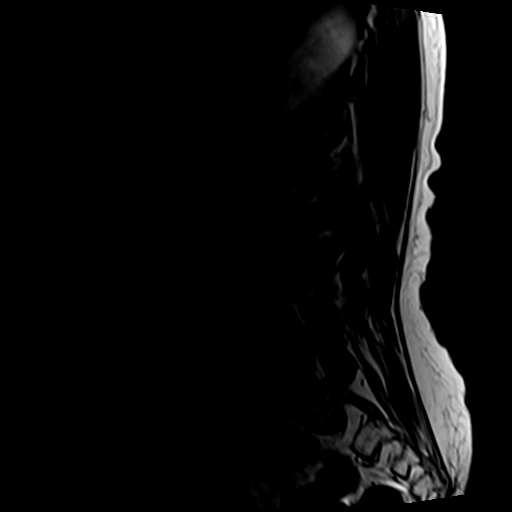
[im 6/15]
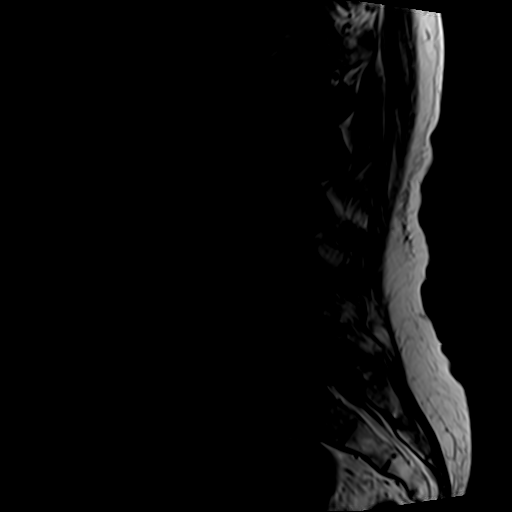
[im 9/15]
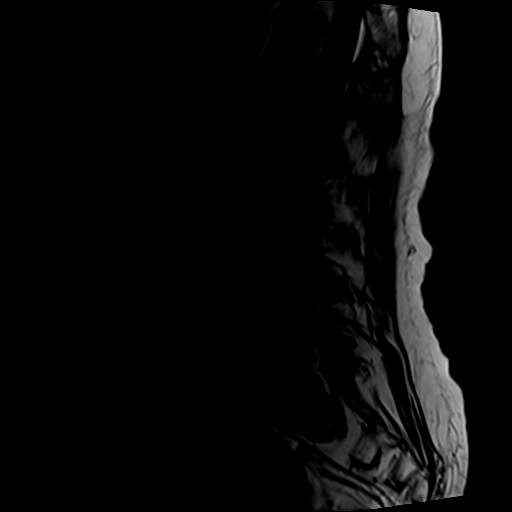
[im 12/15]
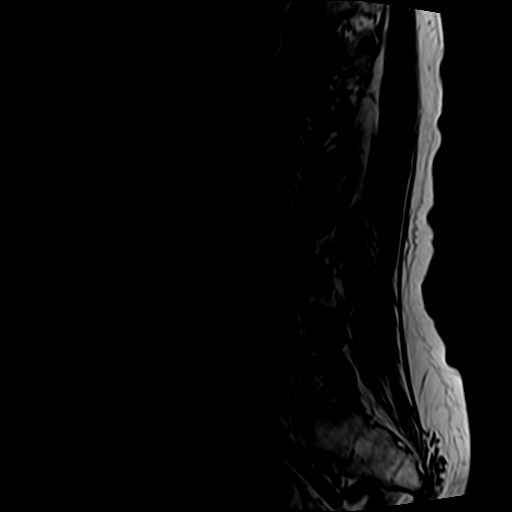
[im 15/15]
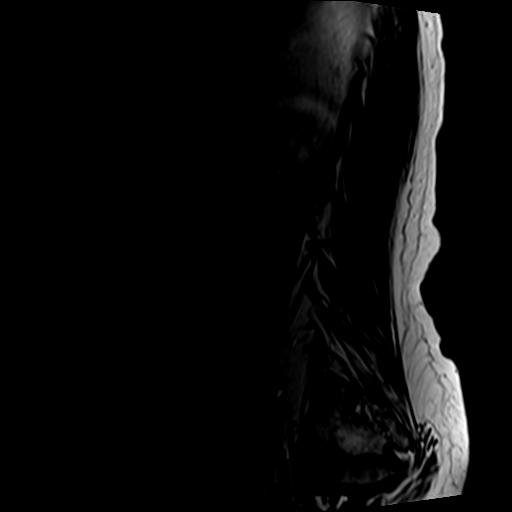

[Series 5: T2 · axial · 4.0mm · 0.70mm/px · z∈[-48,+155]mm · 9 of 37 slices shown (2 of 2)]
[im 1/37]
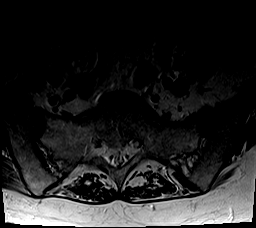
[im 6/37]
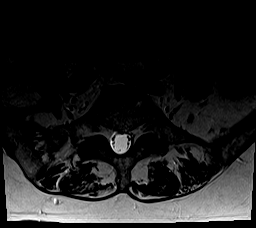
[im 11/37]
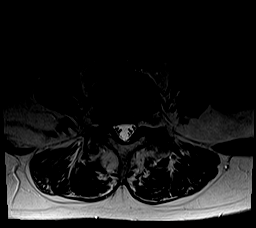
[im 16/37]
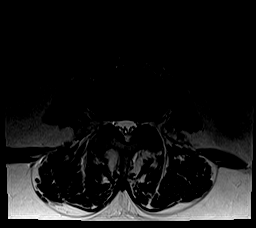
[im 19/37]
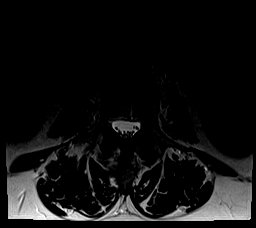
[im 21/37]
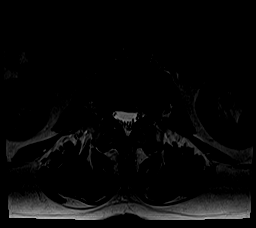
[im 26/37]
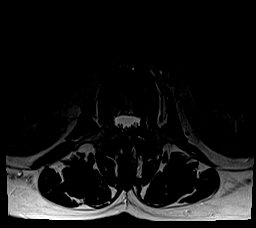
[im 31/37]
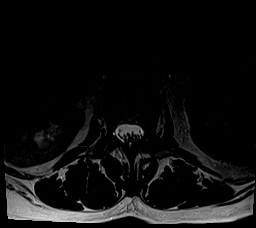
[im 37/37]
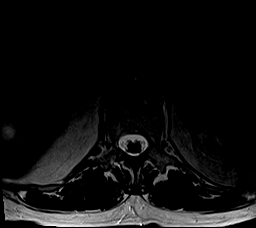

[Series 6: T1 · axial · 4.0mm · 0.35mm/px · z∈[-48,+124]mm · 4 of 37 slices shown (2 of 2)]
[im 1/37]
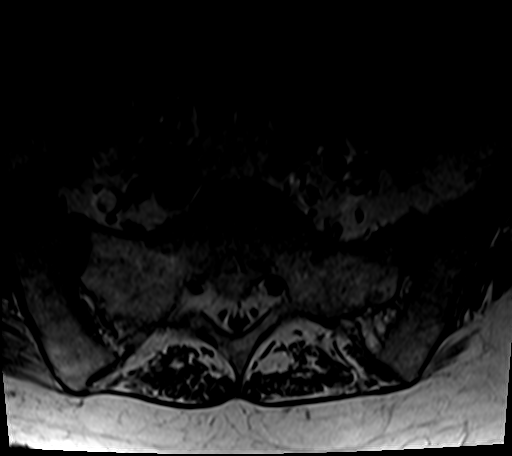
[im 6/37]
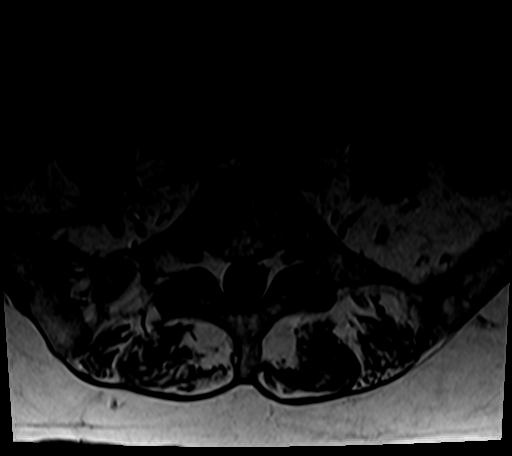
[im 19/37]
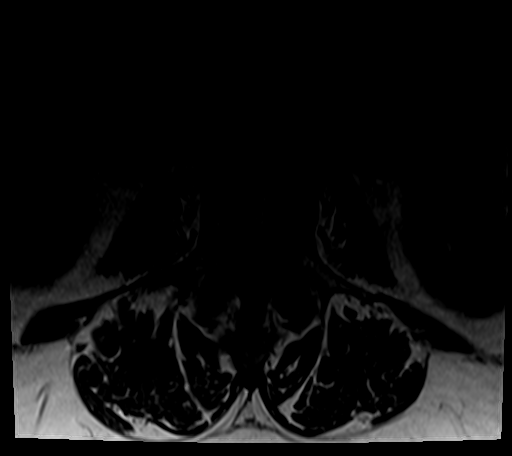
[im 31/37]
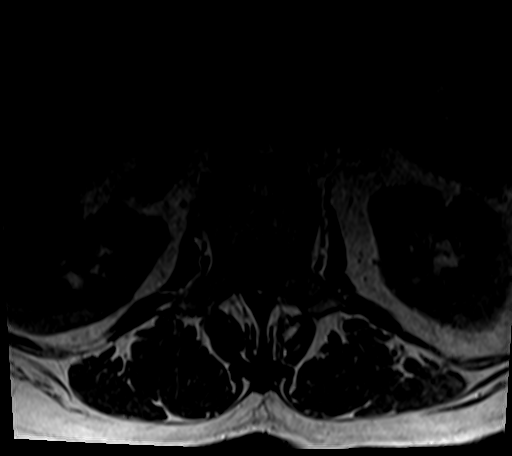

[25 of 48 positions shown; findings below may reference images not displayed]

FINDINGS: Segmentation: Designating normal lumbar segmentation results in
hypoplastic or absent ribs at T12. Correlation with radiographs is
recommended prior to any operative intervention.

Alignment: Grade 1 anterolisthesis of L4 on L5 measures 4-5 mm.
There is subtle underlying levoconvex lumbar scoliosis. Preserved
lumbar lordosis elsewhere.

Vertebrae: No marrow edema or evidence of acute osseous abnormality.
Visualized bone marrow signal is within normal limits. Intact
visible sacrum and SI joints.

Conus medullaris and cauda equina: Conus extends to the T12-L1
level. No lower spinal cord or conus signal abnormality. In general
the cauda equina nerve roots appear normal.

Paraspinal and other soft tissues: Partially visible small T2
hyperintense areas in the liver are likely small benign cysts or
hemangiomas but the largest is incompletely characterized on series
5, image 1 (approximately 2 cm). Negative visible other abdominal
viscera, paraspinal soft tissues.

Disc levels:

T11-T12: Negative.

T12-L1: Mild disc space loss and disc bulging eccentric to the left.
No stenosis.

L1-L2:  Negative.

L2-L3: Mild disc space loss and mostly far lateral disc bulging.
Mild facet and ligament flavum hypertrophy. No stenosis.

L3-L4: Mild disc space loss and mostly far lateral disc bulging.
Mild to moderate facet and ligament flavum hypertrophy. No
significant stenosis.

L4-L5: Grade 1 anterolisthesis. Circumferential disc/pseudo disc
eccentric to the right. Mild to moderate facet and mild ligament
flavum hypertrophy. No significant spinal stenosis but mild right
lateral recess stenosis (descending right L5 nerve level series 5,
image 28). And mild right L4 neural foraminal stenosis related in
part to broad-based right foraminal disc.

L5-S1: Negative disc. Mild facet hypertrophy greater on the left. No
stenosis.
IMPRESSION: 1. Grade 1 anterolisthesis at L4-L5 with disc and posterior element
degeneration greater on the right. Mild right lateral recess and
right foraminal stenosis. Query Right L4 and/or L5 radiculitis.

2. Mild for age lumbar spine degeneration elsewhere. No spinal
stenosis or other convincing neural impingement.

3. Partially visible small T2 hyperintense lesions in the liver.
Favor benign etiology such as cysts or hemangiomas, but incompletely
characterized. Abdomen MRI (liver protocol without and with
contrast) should confirm.

## 2024-03-30 DIAGNOSIS — F411 Generalized anxiety disorder: Secondary | ICD-10-CM | POA: Diagnosis not present

## 2024-03-30 DIAGNOSIS — F331 Major depressive disorder, recurrent, moderate: Secondary | ICD-10-CM | POA: Diagnosis not present

## 2024-04-04 DIAGNOSIS — R634 Abnormal weight loss: Secondary | ICD-10-CM | POA: Diagnosis not present

## 2024-04-04 DIAGNOSIS — K529 Noninfective gastroenteritis and colitis, unspecified: Secondary | ICD-10-CM | POA: Diagnosis not present

## 2024-04-04 DIAGNOSIS — R1013 Epigastric pain: Secondary | ICD-10-CM | POA: Diagnosis not present

## 2024-04-09 DIAGNOSIS — K529 Noninfective gastroenteritis and colitis, unspecified: Secondary | ICD-10-CM | POA: Diagnosis not present

## 2024-04-12 DIAGNOSIS — R634 Abnormal weight loss: Secondary | ICD-10-CM | POA: Diagnosis not present

## 2024-04-12 DIAGNOSIS — K5289 Other specified noninfective gastroenteritis and colitis: Secondary | ICD-10-CM | POA: Diagnosis not present

## 2024-04-26 DIAGNOSIS — D3A8 Other benign neuroendocrine tumors: Secondary | ICD-10-CM | POA: Diagnosis not present

## 2024-04-26 DIAGNOSIS — K7689 Other specified diseases of liver: Secondary | ICD-10-CM | POA: Diagnosis not present

## 2024-04-26 DIAGNOSIS — K6389 Other specified diseases of intestine: Secondary | ICD-10-CM | POA: Diagnosis not present

## 2024-04-26 DIAGNOSIS — K8689 Other specified diseases of pancreas: Secondary | ICD-10-CM | POA: Diagnosis not present

## 2024-04-26 DIAGNOSIS — K869 Disease of pancreas, unspecified: Secondary | ICD-10-CM | POA: Diagnosis not present

## 2024-04-26 DIAGNOSIS — K31A0 Gastric intestinal metaplasia, unspecified: Secondary | ICD-10-CM | POA: Diagnosis not present

## 2024-04-26 DIAGNOSIS — K862 Cyst of pancreas: Secondary | ICD-10-CM | POA: Diagnosis not present

## 2024-05-22 DIAGNOSIS — D3A8 Other benign neuroendocrine tumors: Secondary | ICD-10-CM | POA: Diagnosis not present

## 2024-05-22 DIAGNOSIS — K591 Functional diarrhea: Secondary | ICD-10-CM | POA: Diagnosis not present

## 2024-05-22 DIAGNOSIS — C229 Malignant neoplasm of liver, not specified as primary or secondary: Secondary | ICD-10-CM | POA: Diagnosis not present
